# Patient Record
Sex: Female | Born: 1990 | Hispanic: No | Marital: Single | Smoking: Current every day smoker
Health system: Southern US, Community
[De-identification: ages and names within clinical notes are randomized; demographics above are authoritative.]

## PROBLEM LIST (undated history)

## (undated) DIAGNOSIS — J45909 Unspecified asthma, uncomplicated: Secondary | ICD-10-CM

## (undated) DIAGNOSIS — F319 Bipolar disorder, unspecified: Secondary | ICD-10-CM

## (undated) DIAGNOSIS — F259 Schizoaffective disorder, unspecified: Secondary | ICD-10-CM

## (undated) DIAGNOSIS — F250 Schizoaffective disorder, bipolar type: Secondary | ICD-10-CM

---
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
## 2010-05-21 ENCOUNTER — Emergency Department: Payer: Self-pay | Admitting: Emergency Medicine
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
## 2010-11-18 LAB — CBC
HCT: 40.7
MCV: 93.2
RBC: 4.37
WBC: 6.4

## 2010-11-18 LAB — BASIC METABOLIC PANEL
CO2: 26
Chloride: 105
Potassium: 3.9
Sodium: 138

## 2010-11-18 LAB — URINALYSIS, ROUTINE W REFLEX MICROSCOPIC
Bilirubin Urine: NEGATIVE
Ketones, ur: NEGATIVE
Nitrite: NEGATIVE
Protein, ur: NEGATIVE
Specific Gravity, Urine: 1.031 — ABNORMAL HIGH
Urobilinogen, UA: 1

## 2010-11-18 LAB — LIPID PANEL
Cholesterol: 130
Total CHOL/HDL Ratio: 4.1

## 2010-11-18 LAB — CORTISOL-AM, BLOOD: Cortisol - AM: 14.5

## 2010-11-18 LAB — T3, FREE: T3, Free: 3.9 (ref 2.3–4.2)

## 2010-11-18 LAB — GC/CHLAMYDIA PROBE AMP, URINE
Chlamydia, Swab/Urine, PCR: NEGATIVE
GC Probe Amp, Urine: NEGATIVE

## 2010-11-18 LAB — T4, FREE: Free T4: 1.14

## 2010-11-18 LAB — RPR: RPR Ser Ql: NONREACTIVE

## 2010-11-18 LAB — URINE MICROSCOPIC-ADD ON

## 2011-03-05 ENCOUNTER — Ambulatory Visit: Payer: Self-pay | Admitting: Internal Medicine

## 2011-10-05 ENCOUNTER — Emergency Department: Payer: Self-pay | Admitting: Internal Medicine

## 2011-12-30 ENCOUNTER — Emergency Department: Payer: Self-pay | Admitting: Emergency Medicine

## 2011-12-30 LAB — COMPREHENSIVE METABOLIC PANEL
Albumin: 3.9 g/dL (ref 3.4–5.0)
Alkaline Phosphatase: 85 U/L (ref 50–136)
BUN: 15 mg/dL (ref 7–18)
Calcium, Total: 8.8 mg/dL (ref 8.5–10.1)
Chloride: 110 mmol/L — ABNORMAL HIGH (ref 98–107)
EGFR (African American): >60
Glucose: 98 mg/dL (ref 65–99)
SGOT(AST): 22 U/L (ref 15–37)
Sodium: 139 mmol/L (ref 136–145)
Total Protein: 7.6 g/dL (ref 6.4–8.2)

## 2011-12-30 LAB — CBC WITH DIFFERENTIAL/PLATELET
Basophil #: 0.1 10*3/uL (ref 0.0–0.1)
Eosinophil #: 0.4 10*3/uL (ref 0.0–0.7)
Eosinophil %: 3.4 %
HCT: 44.4 % (ref 35.0–47.0)
HGB: 15.5 g/dL (ref 12.0–16.0)
Lymphocyte #: 2 10*3/uL (ref 1.0–3.6)
Lymphocyte %: 18.5 %
Neutrophil #: 7.7 10*3/uL — ABNORMAL HIGH (ref 1.4–6.5)
Neutrophil %: 71.6 %
RBC: 4.71 10*6/uL (ref 3.80–5.20)

## 2011-12-30 LAB — URINALYSIS, COMPLETE
Bilirubin,UR: NEGATIVE
Blood: NEGATIVE
Leukocyte Esterase: NEGATIVE
Ph: 6 (ref 4.5–8.0)
Protein: NEGATIVE
RBC,UR: 2 /HPF (ref 0–5)
Specific Gravity: 1.03 (ref 1.003–1.030)

## 2011-12-30 LAB — LIPASE, BLOOD: Lipase: 293 U/L (ref 73–393)

## 2011-12-30 LAB — PREGNANCY, URINE: Pregnancy Test, Urine: NEGATIVE m[IU]/mL

## 2012-05-14 ENCOUNTER — Emergency Department: Payer: Self-pay | Admitting: Emergency Medicine

## 2012-08-05 ENCOUNTER — Emergency Department: Payer: Self-pay | Admitting: Emergency Medicine

## 2012-08-05 LAB — URINALYSIS, COMPLETE
Bacteria: NONE SEEN
Bilirubin,UR: NEGATIVE
Glucose,UR: NEGATIVE mg/dL (ref 0–75)
Leukocyte Esterase: NEGATIVE
Nitrite: NEGATIVE
Ph: 5 (ref 4.5–8.0)
Protein: NEGATIVE
RBC,UR: <1 /HPF (ref 0–5)
Specific Gravity: 1.019 (ref 1.003–1.030)
WBC UR: 1 /HPF (ref 0–5)

## 2012-08-05 LAB — CBC
HGB: 14.7 g/dL (ref 12.0–16.0)
MCH: 32.3 pg (ref 26.0–34.0)
MCHC: 34.3 g/dL (ref 32.0–36.0)
MCV: 94 fL (ref 80–100)
Platelet: 199 10*3/uL (ref 150–440)
RDW: 13.4 % (ref 11.5–14.5)
WBC: 10.1 10*3/uL (ref 3.6–11.0)

## 2012-08-05 LAB — COMPREHENSIVE METABOLIC PANEL
Albumin: 3.7 g/dL (ref 3.4–5.0)
Anion Gap: 8 (ref 7–16)
Calcium, Total: 9.1 mg/dL (ref 8.5–10.1)
Chloride: 110 mmol/L — ABNORMAL HIGH (ref 98–107)
EGFR (African American): >60
EGFR (Non-African Amer.): >60
Osmolality: 280 (ref 275–301)
Potassium: 3.6 mmol/L (ref 3.5–5.1)
SGPT (ALT): 22 U/L (ref 12–78)
Sodium: 140 mmol/L (ref 136–145)
Total Protein: 7.6 g/dL (ref 6.4–8.2)

## 2012-08-06 ENCOUNTER — Emergency Department: Payer: Self-pay | Admitting: Emergency Medicine

## 2012-11-05 ENCOUNTER — Emergency Department: Payer: Self-pay | Admitting: Emergency Medicine

## 2012-11-05 LAB — CBC
HCT: 44.3 % (ref 35.0–47.0)
HGB: 15.6 g/dL (ref 12.0–16.0)
MCH: 32.9 pg (ref 26.0–34.0)
MCHC: 35.1 g/dL (ref 32.0–36.0)
MCV: 94 fL (ref 80–100)
Platelet: 220 10*3/uL (ref 150–440)
WBC: 10.1 10*3/uL (ref 3.6–11.0)

## 2012-11-05 LAB — DRUG SCREEN, URINE
Barbiturates, Ur Screen: NEGATIVE (ref ?–200)
Benzodiazepine, Ur Scrn: NEGATIVE (ref ?–200)
Cocaine Metabolite,Ur ~~LOC~~: NEGATIVE (ref ?–300)
MDMA (Ecstasy)Ur Screen: NEGATIVE (ref ?–500)
Methadone, Ur Screen: NEGATIVE (ref ?–300)
Opiate, Ur Screen: NEGATIVE (ref ?–300)
Tricyclic, Ur Screen: NEGATIVE (ref ?–1000)

## 2012-11-05 LAB — ETHANOL
Ethanol %: 0.224 % — ABNORMAL HIGH (ref 0.000–0.080)
Ethanol: 224 mg/dL

## 2012-11-05 LAB — COMPREHENSIVE METABOLIC PANEL
Albumin: 3.8 g/dL (ref 3.4–5.0)
Alkaline Phosphatase: 88 U/L (ref 50–136)
Chloride: 107 mmol/L (ref 98–107)
Creatinine: 0.97 mg/dL (ref 0.60–1.30)
EGFR (African American): >60
Osmolality: 279 (ref 275–301)
Potassium: 3.6 mmol/L (ref 3.5–5.1)
SGOT(AST): 33 U/L (ref 15–37)
Total Protein: 7.6 g/dL (ref 6.4–8.2)

## 2012-11-05 LAB — URINALYSIS, COMPLETE
Bacteria: NONE SEEN
Bilirubin,UR: NEGATIVE
Glucose,UR: NEGATIVE mg/dL (ref 0–75)
Ketone: NEGATIVE
Leukocyte Esterase: NEGATIVE
Nitrite: NEGATIVE
RBC,UR: <1 /HPF (ref 0–5)
Specific Gravity: 1.005 (ref 1.003–1.030)
Squamous Epithelial: 2
WBC UR: 1 /HPF (ref 0–5)

## 2013-01-10 ENCOUNTER — Emergency Department: Payer: Self-pay | Admitting: Emergency Medicine

## 2013-04-20 ENCOUNTER — Emergency Department: Payer: Self-pay | Admitting: Emergency Medicine

## 2013-04-30 ENCOUNTER — Emergency Department: Payer: Self-pay | Admitting: Emergency Medicine

## 2013-05-03 LAB — BETA STREP CULTURE(ARMC)

## 2013-06-20 ENCOUNTER — Emergency Department: Payer: Self-pay | Admitting: Emergency Medicine

## 2013-06-20 LAB — DRUG SCREEN, URINE
Amphetamines, Ur Screen: NEGATIVE (ref ?–1000)
BARBITURATES, UR SCREEN: NEGATIVE (ref ?–200)
Benzodiazepine, Ur Scrn: NEGATIVE (ref ?–200)
CANNABINOID 50 NG, UR ~~LOC~~: POSITIVE (ref ?–50)
Cocaine Metabolite,Ur ~~LOC~~: POSITIVE (ref ?–300)
MDMA (Ecstasy)Ur Screen: NEGATIVE (ref ?–500)
Methadone, Ur Screen: NEGATIVE (ref ?–300)
Opiate, Ur Screen: NEGATIVE (ref ?–300)
PHENCYCLIDINE (PCP) UR S: NEGATIVE (ref ?–25)
TRICYCLIC, UR SCREEN: NEGATIVE (ref ?–1000)

## 2013-06-20 LAB — COMPREHENSIVE METABOLIC PANEL
ALBUMIN: 4 g/dL (ref 3.4–5.0)
Alkaline Phosphatase: 81 U/L
Anion Gap: 14 (ref 7–16)
BILIRUBIN TOTAL: 0.2 mg/dL (ref 0.2–1.0)
BUN: 12 mg/dL (ref 7–18)
Calcium, Total: 8.5 mg/dL (ref 8.5–10.1)
Chloride: 105 mmol/L (ref 98–107)
Co2: 19 mmol/L — ABNORMAL LOW (ref 21–32)
Creatinine: 1.12 mg/dL (ref 0.60–1.30)
EGFR (African American): >60
EGFR (Non-African Amer.): >60
Glucose: 143 mg/dL — ABNORMAL HIGH (ref 65–99)
Osmolality: 278 (ref 275–301)
Potassium: 3.2 mmol/L — ABNORMAL LOW (ref 3.5–5.1)
SGOT(AST): 36 U/L (ref 15–37)
SGPT (ALT): 27 U/L (ref 12–78)
Sodium: 138 mmol/L (ref 136–145)
TOTAL PROTEIN: 8.2 g/dL (ref 6.4–8.2)

## 2013-06-20 LAB — CBC WITH DIFFERENTIAL/PLATELET
BASOS ABS: 0.1 10*3/uL (ref 0.0–0.1)
Basophil %: 0.9 %
EOS PCT: 0.5 %
Eosinophil #: 0.1 10*3/uL (ref 0.0–0.7)
HCT: 46.2 % (ref 35.0–47.0)
HGB: 15.1 g/dL (ref 12.0–16.0)
LYMPHS ABS: 3 10*3/uL (ref 1.0–3.6)
Lymphocyte %: 21.2 %
MCH: 31.8 pg (ref 26.0–34.0)
MCHC: 32.7 g/dL (ref 32.0–36.0)
MCV: 97 fL (ref 80–100)
MONO ABS: 0.9 x10 3/mm (ref 0.2–0.9)
Monocyte %: 6.5 %
NEUTROS PCT: 70.9 %
Neutrophil #: 10.1 10*3/uL — ABNORMAL HIGH (ref 1.4–6.5)
Platelet: 290 10*3/uL (ref 150–440)
RBC: 4.74 10*6/uL (ref 3.80–5.20)
RDW: 14.2 % (ref 11.5–14.5)
WBC: 14.2 10*3/uL — ABNORMAL HIGH (ref 3.6–11.0)

## 2013-06-20 LAB — ETHANOL
Ethanol %: 0.268 % — ABNORMAL HIGH (ref 0.000–0.080)
Ethanol: 268 mg/dL

## 2013-10-22 ENCOUNTER — Emergency Department: Payer: Self-pay | Admitting: Emergency Medicine

## 2014-04-20 ENCOUNTER — Emergency Department: Payer: Self-pay | Admitting: Emergency Medicine

## 2014-06-02 NOTE — Consult Note (Signed)
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   Patient was seen by consultant.   Consult note dictated.   Recommend further assessment or treatment.   Comments: [name] was brought to ER after episode of seizures. She was positive for alcohol and MJ. She is not interested in substance abuse treatment. She is not suicidal or homicidal.   PLAN: 1. The patient no longer meets criteria for IVC. I will terminate proceedings.   2. Substance abuse: The patient minimizes her problems and declines substance abuse treatment.   3. Anxiety: The patient was given information about local walk-in clinic.  4. No medications recommended.  Electronic Signatures: Kristine LineaPucilowska, Haruka Kowaleski (MD)  (Signed 26-Sep-14 11:29)  Authored: Brief Consult Note   Last Updated: 26-Sep-14 11:29 by Kristine LineaPucilowska, Jalisha Enneking (MD)

## 2014-06-02 NOTE — Consult Note (Signed)
PATIENT NAME:  Briana Bradley, Briana Bradley MR#:  [id number] DATE OF BIRTH:  Sep 16, 1990  DATE OF CONSULTATION:  11/05/2012  REFERRING PHYSICIAN:  Enedina Finnerandolph N. Manson PasseyBrown, MD CONSULTING PHYSICIAN:  Sury Wentworth Bradley. Jaslin Novitski, MD  REASON FOR CONSULTATION: To evaluate a suicidal patient.   IDENTIFYING DATA: [name] is a 24 year old female with history of mood instability, substance abuse and seizures.   CHIEF COMPLAINT: "I want to go home."   HISTORY OF PRESENT ILLNESS: [name] has not been seeing any mental health professionals lately. She reports that she has been doing very well and she felt stable. She was brought to the Emergency Room after an episode of seizures. She has not been taking antiseizure medication due to loss of insurance and poor financial standing.  She was admitted to [facility] in 2009 for status epilepticus but has been off medications since she lost her insurance. She denies frequent seizures and has no idea what precipitated the current episode. Reportedly, she had a 40-ounce beer and a shot of vodka when it happened. She was sitting comfortable in a recliner. She is negative for any other substances but it is not impossible that she has been using substances that we do not detect. She was positive for marijuana on urine tox screen but denies using synthetic marijuana or spice. The patient reportedly, while postictal, made some suicidal statements. When her mental status cleared, the patient adamantly denies wanting to hurt herself, any thoughts of suicide, intention or a plan. We spoke with her father who feels that the patient is well-balanced and safe to return to home. She denies symptoms of depression. She reports some anxiety, especially when people argue with her or around her, or if there is any violence she gets frightened and panicky. She will consider seeing a mental health professional to treat that. She denies excessive alcohol use or illicit substance use. There  is no history of prescription pill abuse.   PAST PSYCHIATRIC HISTORY: She [facility] at [facility] as a teenager after an episode of cutting. She denies any current cutting now. She does not consider cutting a suicide attempt and denies that she ever attempted suicide.   FAMILY PSYCHIATRIC HISTORY: The father is a recovering alcoholic, mother with history of cocaine, brother with alcohol problem and cutting.   PAST MEDICAL HISTORY: Seizures.   ALLERGIES: No known drug allergies.   MEDICATIONS ON ADMISSION: None.   SOCIAL HISTORY: She is from our area. She did not graduate from high school but has her GED. 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 She can be fully trusted with the baby. She is not in a relationship. She reports that she has been amenorrheic for the past 2 years. She has been checked, some tests have been done. There is nothing wrong with her but she does not believe that she could have children.   REVIEW OF SYSTEMS CONSTITUTIONAL: No fevers or chills. No weight changes.  EYES: No double or blurred vision.  ENT: No hearing loss.  RESPIRATORY: No shortness of breath or cough.  CARDIOVASCULAR: No chest pain or orthopnea.  GASTROINTESTINAL: No abdominal pain, nausea, vomiting or diarrhea.  GENITOURINARY: No incontinence or frequency.  Positive for amenorrhea.  ENDOCRINE: No heat or cold intolerance.  LYMPHATIC: No anemia or easy bruising.  INTEGUMENTARY: No acne or rash.  MUSCULOSKELETAL: No muscle or joint pain.  NEUROLOGIC: No tingling or weakness.  PSYCHIATRIC:  See history of present illness for details.   PHYSICAL EXAMINATION VITAL SIGNS: Blood pressure 137/77, pulse 104, respirations 22, temperature 98.  GENERAL: This is a slightly obese female in no acute distress. The rest of the physical examination is deferred to her primary attending.   LABORATORY DATA: Chemistries within normal limits. Blood  alcohol level is 0.224.  LFTs within normal limits. Urine tox screen positive for cannabinoids. CBC within normal limits. Urinalysis is not suggestive of urinary tract infection. Urine pregnancy test is negative.   MENTAL STATUS EXAMINATION: The patient is alert and oriented to person, place, time and situation. She is pleasant, polite and cooperative. She is well groomed, wearing hospital scrubs. She has multiple tattoos. She maintains good eye contact. Her speech is of normal rhythm, rate and volume. Mood is fine with full affect. Thought process is logical and goal oriented. Thought content: She denies suicidal or homicidal ideation. There are no delusions or paranoia. There are no auditory or visual hallucinations. Her cognition is grossly intact. Her insight and judgment are questionable.   DIAGNOSES AXIS I: Alcohol abuse, cannabis abuse, panic disorder without agoraphobia, history of diagnosis of bipolar disorder.  AXIS II: Deferred.  AXIS III: Seizures, amenorrhea. AXIS IV: Mental illness, substance abuse. AXIS V: GAF 50.   PLAN 1.  The patient no longer meets criteria for IVC. I will terminate proceedings. Please discharge as appropriate.  2.  Substance abuse: The patient minimizes her problems and declines substance abuse treatment.  3.  Anxiety: The patient was given information about local walk-in clinic if she wishes to treat anxiety.  4.  No medications recommended.   ____________________________ Ellin Goodie. Jennet Maduro, MD jbp:cs D: 11/05/2012 20:18:39 ET T: 11/05/2012 20:39:37 ET JOB#: [id number]  cc: Constance Hackenberg Bradley. Jennet Maduro, MD, <Dictator> Shari Prows MD ELECTRONICALLY SIGNED 11/18/2012 7:41

## 2014-06-10 ENCOUNTER — Emergency Department: Admit: 2014-06-10 | Disposition: A | Discharge status: Home / Self Care | Payer: Self-pay | Admitting: Internal Medicine

## 2014-06-10 LAB — COMPREHENSIVE METABOLIC PANEL
ALBUMIN: 4.2 g/dL
ALK PHOS: 66 U/L
Anion Gap: 4 — ABNORMAL LOW (ref 7–16)
BUN: 14 mg/dL
Bilirubin,Total: 0.5 mg/dL
Calcium, Total: 8.8 mg/dL — ABNORMAL LOW
Chloride: 108 mmol/L
Co2: 27 mmol/L
Creatinine: 0.81 mg/dL
EGFR (African American): >60
EGFR (Non-African Amer.): >60
GLUCOSE: 76 mg/dL
POTASSIUM: 3.7 mmol/L
SGOT(AST): 18 U/L
SGPT (ALT): 15 U/L
Sodium: 139 mmol/L
Total Protein: 7.6 g/dL

## 2014-06-10 LAB — URINALYSIS, COMPLETE
BILIRUBIN, UR: NEGATIVE
BLOOD: NEGATIVE
Bacteria: NONE SEEN
Glucose,UR: NEGATIVE mg/dL (ref 0–75)
KETONE: NEGATIVE
LEUKOCYTE ESTERASE: NEGATIVE
Nitrite: NEGATIVE
PH: 5 (ref 4.5–8.0)
Specific Gravity: 1.036 (ref 1.003–1.030)

## 2014-06-10 LAB — CBC
HCT: 42.9 % (ref 35.0–47.0)
HGB: 14.3 g/dL (ref 12.0–16.0)
MCH: 31 pg (ref 26.0–34.0)
MCHC: 33.3 g/dL (ref 32.0–36.0)
MCV: 93 fL (ref 80–100)
Platelet: 246 10*3/uL (ref 150–440)
RBC: 4.6 10*6/uL (ref 3.80–5.20)
RDW: 13 % (ref 11.5–14.5)
WBC: 11.2 10*3/uL — ABNORMAL HIGH (ref 3.6–11.0)

## 2014-06-10 LAB — GC/CHLAMYDIA PROBE AMP

## 2014-06-10 LAB — WET PREP, GENITAL

## 2014-12-04 ENCOUNTER — Emergency Department
Admission: EM | Admit: 2014-12-04 | Discharge: 2014-12-04 | Disposition: A | Discharge status: Home / Self Care | Payer: Self-pay | Attending: Emergency Medicine | Admitting: Emergency Medicine

## 2014-12-04 ENCOUNTER — Encounter: Payer: Self-pay | Admitting: Emergency Medicine

## 2014-12-04 DIAGNOSIS — G44019 Episodic cluster headache, not intractable: Secondary | ICD-10-CM | POA: Insufficient documentation

## 2014-12-04 DIAGNOSIS — Z3202 Encounter for pregnancy test, result negative: Secondary | ICD-10-CM | POA: Insufficient documentation

## 2014-12-04 DIAGNOSIS — Z720 Tobacco use: Secondary | ICD-10-CM | POA: Insufficient documentation

## 2014-12-04 DIAGNOSIS — R630 Anorexia: Secondary | ICD-10-CM | POA: Insufficient documentation

## 2014-12-04 HISTORY — DX: Unspecified asthma, uncomplicated: J45.909

## 2014-12-04 LAB — URINALYSIS COMPLETE WITH MICROSCOPIC (ARMC ONLY)
BACTERIA UA: NONE SEEN
Bilirubin Urine: NEGATIVE
Glucose, UA: NEGATIVE mg/dL
Ketones, ur: NEGATIVE mg/dL
LEUKOCYTES UA: NEGATIVE
NITRITE: NEGATIVE
PROTEIN: NEGATIVE mg/dL
SPECIFIC GRAVITY, URINE: 1.016 (ref 1.005–1.030)
pH: 6 (ref 5.0–8.0)

## 2014-12-04 LAB — PREGNANCY, URINE: PREG TEST UR: NEGATIVE

## 2014-12-04 MED ORDER — DIPHENHYDRAMINE HCL 25 MG PO CAPS
50.0000 mg | ORAL_CAPSULE | Freq: Once | ORAL | Status: AC
  Administered 2014-12-04: 50 mg via ORAL
  Filled 2014-12-04: qty 2

## 2014-12-04 MED ORDER — SODIUM CHLORIDE 0.9 % IV BOLUS (SEPSIS)
500.0000 mL | Freq: Once | INTRAVENOUS | Status: AC
  Administered 2014-12-04: 500 mL via INTRAVENOUS

## 2014-12-04 MED ORDER — KETOROLAC TROMETHAMINE 30 MG/ML IJ SOLN
30.0000 mg | Freq: Once | INTRAMUSCULAR | Status: AC
  Administered 2014-12-04: 30 mg via INTRAVENOUS
  Filled 2014-12-04: qty 1

## 2014-12-04 MED ORDER — METOCLOPRAMIDE HCL 5 MG/ML IJ SOLN
10.0000 mg | Freq: Once | INTRAMUSCULAR | Status: AC
  Administered 2014-12-04: 10 mg via INTRAVENOUS
  Filled 2014-12-04: qty 2

## 2014-12-04 MED ORDER — ORPHENADRINE CITRATE 30 MG/ML IJ SOLN
60.0000 mg | INTRAMUSCULAR | Status: AC
  Administered 2014-12-04: 60 mg via INTRAVENOUS
  Filled 2014-12-04: qty 2

## 2014-12-04 NOTE — ED Provider Notes (Signed)
[facility] Emergency Department Provider Note ____________________________________________  Time seen: 1513  I have reviewed the triage vital signs and the nursing notes.  HISTORY  Chief Complaint  Headache  HPI Briana Bradley is a 24 y.o. female the ED for evaluation of a headache over the last 4 days. She describes the headache as pressure, that she localizes from the bilateral temples down to the back of the neck. She also notes a decrease in appetite, light sensitivity, some intermittent dizziness and blurred vision. She denies any syncope, weakness, or memory loss. She has noted a decreased appetite over the last few days, and some intermittent nausea and vomiting over the last 3 days. She reports some easing off of the headache pain when she doses of ibuprofen and Tylenol. She notes the pain a 6/10 with the medications, and a 10/10 at worst. She denies any head injury, trauma, or recent illness. He does not have a history of workup for migraine.  Past Medical History  Diagnosis Date  . Asthma     There are no active problems to display for this patient.   History reviewed. No pertinent past surgical history.  Current Outpatient Rx  Name  Route  Sig  Dispense  Refill  . albuterol (PROVENTIL HFA;VENTOLIN HFA) 108 (90 BASE) MCG/ACT inhaler   Inhalation   Inhale 2 puffs into the lungs every 6 (six) hours as needed for wheezing or shortness of breath.           Allergies Review of patient's allergies indicates no known allergies.  No family history on file.  Social History Social History  Substance Use Topics  . Smoking status: Current Some Day Smoker  . Smokeless tobacco: None  . Alcohol Use: No   Review of Systems  Constitutional: Negative for fever. Eyes: Negative for visual changes. ENT: Negative for sore throat. Cardiovascular: Negative for chest pain. Respiratory: Negative for shortness of breath. Gastrointestinal: Negative for  abdominal pain, vomiting and diarrhea. Genitourinary: Negative for dysuria. Musculoskeletal: Negative for back pain. Skin: Negative for rash. Neurological: Reports headaches as above. Negative for focal weakness or numbness. ____________________________________________  PHYSICAL EXAM:  VITAL SIGNS: ED Triage Vitals  Enc Vitals Group     BP 12/04/14 1416 133/81 mmHg     Pulse Rate 12/04/14 1416 51     Resp 12/04/14 1416 18     Temp 12/04/14 1416 98.6 F (37 C)     Temp Source 12/04/14 1416 Oral     SpO2 12/04/14 1416 100 %     Weight 12/04/14 1416 200 lb (90.719 kg)     Height 12/04/14 1416 5\' 6"  (1.676 m)     Head Cir --      Peak Flow --      Pain Score 12/04/14 1417 10     Pain Loc --      Pain Edu? --      Excl. in GC? --    Constitutional: Alert and oriented. Well appearing and in no distress. Head: Normocephalic and atraumatic.      Eyes: Conjunctivae are normal. PERRL. Normal extraocular movements      Ears: Canals clear. TMs intact bilaterally.   Nose: No congestion/rhinorrhea.   Mouth/Throat: Mucous membranes are moist.   Neck: Supple. No thyromegaly. Hematological/Lymphatic/Immunological: No cervical lymphadenopathy. Cardiovascular: Normal rate, regular rhythm.  Respiratory: Normal respiratory effort. No wheezes/rales/rhonchi. Gastrointestinal: Soft and nontender. No distention. Musculoskeletal: Nontender with normal range of motion in all extremities.  Neurologic: Cranial nerves  II through XII grossly intact. Normal UE and LE DTRs bilaterally. Normal gait without ataxia. Normal speech and language. No gross focal neurologic deficits are appreciated. Skin:  Skin is warm, dry and intact. No rash noted. Psychiatric: Mood and affect are normal. Patient exhibits appropriate insight and judgment. ____________________________________________   LABS (pertinent positives/negatives) Labs Reviewed  URINALYSIS COMPLETEWITH MICROSCOPIC ([facility] ONLY) - Abnormal;  Notable for the following:    Color, Urine YELLOW (*)    APPearance HAZY (*)    Hgb urine dipstick 2+ (*)    Squamous Epithelial / LPF 6-30 (*)    All other components within normal limits  PREGNANCY, URINE  ____________________________________________  PROCEDURES  NS 500 ml bolus Toradol 30 mg IVP Reglan 10 mg IVP Norflex 60 mg IVP Benadryl 50 mg PO ____________________________________________  INITIAL IMPRESSION / ASSESSMENT AND PLAN / ED COURSE  Patient with an acute migraine presentation without neuromuscular deficit on exam. Patient with headache pattern consistent with cluster-type headache. Pain decreased following treatment. Follow-up with primary care provider as needed.  ____________________________________________  FINAL CLINICAL IMPRESSION(S) / ED DIAGNOSES  Final diagnoses:  Episodic cluster headache, not intractable      Lissa Hoard, PA-C 12/04/14 1703  Jene Every, MD 12/04/14 2241

## 2014-12-04 NOTE — ED Notes (Signed)
Pt with headache for four days. Unable to get relief with tylenol or ibu at home.

## 2014-12-04 NOTE — Discharge Instructions (Signed)
Cluster Headache  Cluster headaches are recognized by their pattern of deep, intense head pain. They normally occur on one side of your head, but they may "switch sides" in subsequent episodes. Typically, cluster headaches:   · Are severe in nature.    · Occur repeatedly over weeks to months and are followed by periods of no headaches.    · Can last from 15 minutes to 3 hours.    · Occur at the same time each day, often at night.    · Occur several times a day.  CAUSES  The exact cause of cluster headaches is not known. Alcohol use may be associated with cluster headaches.  SIGNS AND SYMPTOMS   · Severe pain that begins in or around your eye or temple.    · One-sided head pain.    · Feeling sick to your stomach (nauseous).    · Sensitivity to light.    · Runny nose.    · Eye redness, tearing, and nasal stuffiness on the side of your head where you are experiencing pain.    · Sweaty, pale skin of the face.    · Droopy or swollen eyelid.    · Restlessness.  DIAGNOSIS   Cluster headaches are diagnosed based on symptoms and a physical exam. Your health care provider may order a CT scan or an MRI of your head or lab tests to see if your headaches are caused by other medical conditions.   TREATMENT   · Medicines for pain relief and to prevent recurrent attacks. Some people may need a combination of medicines.  · Oxygen for pain relief.    · Biofeedback programs to help reduce headache pain.    It may be helpful to keep a headache diary. This may help you find a trend for what is triggering your headaches. Your health care provider can develop a treatment plan.   HOME CARE INSTRUCTIONS   During cluster periods:   · Follow a regular sleep schedule. Do not vary the amount and time that you sleep from day to day. It is important to stay on the same schedule during a cluster period to help prevent headaches.    · Avoid alcohol.    · Stop smoking if you smoke.    SEEK MEDICAL CARE IF:  · You have any changes from your previous  cluster headaches either in intensity or frequency.    · You are not getting relief from medicines you are taking.    SEEK IMMEDIATE MEDICAL CARE IF:   · You faint.    · You have weakness or numbness, especially on one side of your body or face.    · You have double vision.    · You have nausea or vomiting that is not relieved within several hours.    · You cannot keep your balance or have difficulty talking or walking.    · You have neck pain or stiffness.    · You have a fever.  MAKE SURE YOU:  · Understand these instructions.    · Will watch your condition.    · Will get help right away if you are not doing well or get worse.     This information is not intended to replace advice given to you by your health care provider. Make sure you discuss any questions you have with your health care provider.     Document Released: 01/27/2005 Document Revised: 11/17/2012 Document Reviewed: 08/19/2012  Elsevier Interactive Patient Education ©2016 [employer]

## 2015-06-02 ENCOUNTER — Emergency Department
Admission: EM | Admit: 2015-06-02 | Discharge: 2015-06-02 | Disposition: A | Discharge status: Home / Self Care | Payer: Self-pay | Attending: Emergency Medicine | Admitting: Emergency Medicine

## 2015-06-02 ENCOUNTER — Encounter: Payer: Self-pay | Admitting: Emergency Medicine

## 2015-06-02 DIAGNOSIS — H81399 Other peripheral vertigo, unspecified ear: Secondary | ICD-10-CM | POA: Insufficient documentation

## 2015-06-02 DIAGNOSIS — F319 Bipolar disorder, unspecified: Secondary | ICD-10-CM | POA: Insufficient documentation

## 2015-06-02 DIAGNOSIS — Z79899 Other long term (current) drug therapy: Secondary | ICD-10-CM | POA: Insufficient documentation

## 2015-06-02 DIAGNOSIS — J45909 Unspecified asthma, uncomplicated: Secondary | ICD-10-CM | POA: Insufficient documentation

## 2015-06-02 DIAGNOSIS — F17210 Nicotine dependence, cigarettes, uncomplicated: Secondary | ICD-10-CM | POA: Insufficient documentation

## 2015-06-02 HISTORY — DX: Bipolar disorder, unspecified: F31.9

## 2015-06-02 LAB — BASIC METABOLIC PANEL
ANION GAP: 6 (ref 5–15)
BUN: 12 mg/dL (ref 6–20)
CHLORIDE: 109 mmol/L (ref 101–111)
CO2: 26 mmol/L (ref 22–32)
Calcium: 9 mg/dL (ref 8.9–10.3)
Creatinine, Ser: 0.8 mg/dL (ref 0.44–1.00)
GFR calc Af Amer: >60 mL/min (ref >60)
GFR calc non Af Amer: >60 mL/min (ref >60)
GLUCOSE: 93 mg/dL (ref 65–99)
POTASSIUM: 3.6 mmol/L (ref 3.5–5.1)
Sodium: 141 mmol/L (ref 135–145)

## 2015-06-02 LAB — URINALYSIS COMPLETE WITH MICROSCOPIC (ARMC ONLY)
BACTERIA UA: NONE SEEN
Bilirubin Urine: NEGATIVE
Glucose, UA: NEGATIVE mg/dL
Hgb urine dipstick: NEGATIVE
Leukocytes, UA: NEGATIVE
NITRITE: NEGATIVE
PH: 5 (ref 5.0–8.0)
PROTEIN: 30 mg/dL — AB
SPECIFIC GRAVITY, URINE: 1.031 — AB (ref 1.005–1.030)

## 2015-06-02 LAB — CBC
HEMATOCRIT: 42.6 % (ref 35.0–47.0)
HEMOGLOBIN: 14.5 g/dL (ref 12.0–16.0)
MCH: 32.2 pg (ref 26.0–34.0)
MCHC: 34.1 g/dL (ref 32.0–36.0)
MCV: 94.3 fL (ref 80.0–100.0)
Platelets: 199 10*3/uL (ref 150–440)
RBC: 4.51 MIL/uL (ref 3.80–5.20)
RDW: 12.7 % (ref 11.5–14.5)
WBC: 6.1 10*3/uL (ref 3.6–11.0)

## 2015-06-02 MED ORDER — MECLIZINE HCL 25 MG PO TABS: 25.0000 mg | ORAL_TABLET | Freq: Three times a day (TID) | ORAL | Status: DC | PRN

## 2015-06-02 MED ORDER — ONDANSETRON 4 MG PO TBDP: 4.0000 mg | ORAL_TABLET | Freq: Four times a day (QID) | ORAL | Status: DC | PRN

## 2015-06-02 MED ORDER — ONDANSETRON HCL 4 MG/2ML IJ SOLN
4.0000 mg | Freq: Once | INTRAMUSCULAR | Status: AC
  Administered 2015-06-02: 4 mg via INTRAVENOUS
  Filled 2015-06-02: qty 2

## 2015-06-02 MED ORDER — SODIUM CHLORIDE 0.9 % IV BOLUS (SEPSIS)
1000.0000 mL | Freq: Once | INTRAVENOUS | Status: AC
  Administered 2015-06-02: 1000 mL via INTRAVENOUS

## 2015-06-02 MED ORDER — MECLIZINE HCL 25 MG PO TABS
25.0000 mg | ORAL_TABLET | Freq: Once | ORAL | Status: AC
  Administered 2015-06-02: 25 mg via ORAL
  Filled 2015-06-02: qty 1

## 2015-06-02 NOTE — Discharge Instructions (Signed)
We believe your symptoms were caused by benign vertigo.  Please read through the included information and take any prescribed medication(s).  Follow up with your doctor as listed above. ° °If you develop any new or worsening symptoms that concern you, including but not limited to persistent dizziness/vertigo, numbness or weakness in your arms or legs, altered mental status, persistent vomiting, or fever greater than 101, please return immediately to the Emergency Department. ° ° °Vertigo °Vertigo means you feel like you or your surroundings are moving when they are not. Vertigo can be dangerous if it occurs when you are at work, driving, or performing difficult activities.  °CAUSES  °Vertigo occurs when there is a conflict of signals sent to your brain from the visual and sensory systems in your body. There are many different causes of vertigo, including: °· Infections, especially in the inner ear. °· A bad reaction to a drug or misuse of alcohol and medicines. °· Withdrawal from drugs or alcohol. °· Rapidly changing positions, such as lying down or rolling over in bed. °· A migraine headache. °· Decreased blood flow to the brain. °· Increased pressure in the brain from a head injury, infection, tumor, or bleeding. °SYMPTOMS  °You may feel as though the world is spinning around or you are falling to the ground. Because your balance is upset, vertigo can cause nausea and vomiting. You may have involuntary eye movements (nystagmus). °DIAGNOSIS  °Vertigo is usually diagnosed by physical exam. If the cause of your vertigo is unknown, your caregiver may perform imaging tests, such as an MRI scan (magnetic resonance imaging). °TREATMENT  °Most cases of vertigo resolve on their own, without treatment. Depending on the cause, your caregiver may prescribe certain medicines. If your vertigo is related to body position issues, your caregiver may recommend movements or procedures to correct the problem. In rare cases, if your  vertigo is caused by certain inner ear problems, you may need surgery. °HOME CARE INSTRUCTIONS  °· Follow your caregiver's instructions. °· Avoid driving. °· Avoid operating heavy machinery. °· Avoid performing any tasks that would be dangerous to you or others during a vertigo episode. °· Tell your caregiver if you notice that certain medicines seem to be causing your vertigo. Some of the medicines used to treat vertigo episodes can actually make them worse in some people. °SEEK IMMEDIATE MEDICAL CARE IF:  °· Your medicines do not relieve your vertigo or are making it worse. °· You develop problems with talking, walking, weakness, or using your arms, hands, or legs. °· You develop severe headaches. °· Your nausea or vomiting continues or gets worse. °· You develop visual changes. °· A family member notices behavioral changes. °· Your condition gets worse. °MAKE SURE YOU: °· Understand these instructions. °· Will watch your condition. °· Will get help right away if you are not doing well or get worse. °  °This information is not intended to replace advice given to you by your health care provider. Make sure you discuss any questions you have with your health care provider. °  °Document Released: 11/06/2004 Document Revised: 04/21/2011 Document Reviewed: 05/22/2014 °Elsevier Interactive Patient Education ©2016 [employer] ° °

## 2015-06-02 NOTE — ED Provider Notes (Signed)
[facility] Emergency Department Provider Note  ____________________________________________  Time seen: Approximately 10:54 AM  I have reviewed the triage vital signs and the nursing notes.   HISTORY  Chief Complaint Loss of Consciousness and Vomiting    HPI Briana Bradley is a 25 y.o. female she is fairly healthy except for some anxiety issues.  The patient reports that [place], she was on a ride that's on her quickly and in the air. During the ride she suddenly developed dizziness, nausea and felt like the room was spinning. She is not sure, she said she got sweaty and not sure if she could've passed out for a second but her friend reports that are not really sure. She is able to get up off the ride but has felt persistently dizzy whenever she moves her head. She reports that the world's feels like it spinning when she gets up quickly or turns her head rapidly side to side.  No numbness, tingling, headache, weakness. She does report that she was vomiting due to dizziness. This is improved after receiving medicine in the ER. She's not had much to eat today due to the nausea.  There is no previous history of stroke. She denies any numbness tingling or weakness. No trouble speaking. No facial droop.   Past Medical History  Diagnosis Date  . Asthma   . Bipolar 1 disorder (HCC)     There are no active problems to display for this patient.   History reviewed. No pertinent past surgical history.  Current Outpatient Rx  Name  Route  Sig  Dispense  Refill  . albuterol (PROVENTIL HFA;VENTOLIN HFA) 108 (90 BASE) MCG/ACT inhaler   Inhalation   Inhale 2 puffs into the lungs every 6 (six) hours as needed for wheezing or shortness of breath.         . meclizine (ANTIVERT) 25 MG tablet   Oral   Take 1 tablet (25 mg total) by mouth 3 (three) times daily as needed for dizziness.   30 tablet   0   . ondansetron (ZOFRAN ODT) 4 MG  disintegrating tablet   Oral   Take 1 tablet (4 mg total) by mouth every 6 (six) hours as needed for nausea or vomiting.   20 tablet   0     Allergies Review of patient's allergies indicates no known allergies.  No family history on file.  Social History Social History  Substance Use Topics  . Smoking status: Current Every Day Smoker -- 1.00 packs/day    Types: Cigarettes  . Smokeless tobacco: None  . Alcohol Use: No    Review of Systems Constitutional: No fever/chills Eyes: No visual changes. ENT: No sore throat. Cardiovascular: Denies chest pain. Respiratory: Denies shortness of breath. Gastrointestinal: No abdominal pain.  No nausea, no vomiting.  No diarrhea.  No constipation. Genitourinary: Negative for dysuria. Musculoskeletal: Negative for back pain. Skin: Negative for rash. Neurological: Negative for headaches, focal weakness or numbness.  10-point ROS otherwise negative.  Denies pregnancy, reports she is sexually active only with a female partner. ____________________________________________   PHYSICAL EXAM:  VITAL SIGNS: ED Triage Vitals  Enc Vitals Group     BP 06/02/15 0916 123/78 mmHg     Pulse Rate 06/02/15 0916 65     Resp 06/02/15 0916 18     Temp 06/02/15 0916 97.8 F (36.6 C)     Temp Source 06/02/15 0916 Oral     SpO2 06/02/15 0916  97 %     Weight 06/02/15 0916 150 lb (68.04 kg)     Height 06/02/15 0916  (1.676 m)     Head Cir --      Peak Flow --      Pain Score 06/02/15 0921 0     Pain Loc --      Pain Edu? --      Excl. in GC? --    Constitutional: Alert and oriented. Well appearing and in no acute distress. Eyes: Conjunctivae are normal. PERRL. EOMI. Head: Atraumatic. Nose: No congestion/rhinnorhea. Normal tympanic membranes bilateral. Mouth/Throat: Mucous membranes are moist.  Oropharynx non-erythematous. Neck: No stridor.  No carotid bruit. Cardiovascular: Normal rate, regular rhythm. Grossly normal heart sounds.  Good  peripheral circulation. Respiratory: Normal respiratory effort.  No retractions. Lungs CTAB. Gastrointestinal: Soft and nontender.  Musculoskeletal: No lower extremity tenderness nor edema.  No joint effusions. Neurologic:  Normal speech and language. No gross focal neurologic deficits are appreciated. No gait instability.  Based on the patient's chief complaint of performed very detailed examination neurologically. NIH score equals 0, performed by me at bedside. The patient has no pronator drift. The patient has normal cranial nerve exam. Extraocular movements are normal. Visual fields are normal. Patient has 5 out of 5 strength in all extremities. There is no numbness or gross, acute sensory abnormality in the extremities bilaterally. No speech disturbance. No dysarthria. No aphasia. No ataxia. Normal finger nose finger bilat. Patient speaking in full and clear sentences.  Of note, when the patient is asked her head rapidly to left and right she does become nauseated and feels quite dizzy. She has some mild nystagmus that abates after about 10 seconds associated with head movement.   Skin:  Skin is warm, dry and intact. No rash noted. Psychiatric: Mood and affect are normal. Speech and behavior are normal.  ____________________________________________   LABS (all labs ordered are listed, but only abnormal results are displayed)  Labs Reviewed  URINALYSIS COMPLETEWITH MICROSCOPIC ([facility] ONLY) - Abnormal; Notable for the following:    Color, Urine YELLOW (*)    APPearance CLEAR (*)    Ketones, ur TRACE (*)    Specific Gravity, Urine 1.031 (*)    Protein, ur 30 (*)    Squamous Epithelial / LPF 0-5 (*)    All other components within normal limits  BASIC METABOLIC PANEL  CBC   ____________________________________________  EKG  ED ECG REPORT I, Trevion Hoben, the attending physician, personally viewed and interpreted this ECG.  Date: 06/02/2015 EKG Time: 9:30 Rate:  60 Rhythm: normal sinus rhythm QRS Axis: normal Intervals: normal ST/T Wave abnormalities: normal Conduction Disturbances: none Narrative Interpretation: unremarkable  ____________________________________________  RADIOLOGY  Based on the patient's chief complaint, reassuring neurologic exam and age and do not feel the patient is at high risk for acute ischemic stroke, hemorrhage, or or mass/tumor. Based on her presentation, clinical history of this starting during a fair ride and persisting with vertiginous symptoms associated mostly with head movements and feels she likely is suffering from vertigo, unlikely to be of a central source. At the present time based on her age, I do not feel the need for CT imaging of the head is needed. She denies any fevers, headaches, numbness tingling weakness or acute neurologic symptoms. ____________________________________________   PROCEDURES  Procedure(s) performed: None  Critical Care performed: No  ____________________________________________   INITIAL IMPRESSION / ASSESSMENT AND PLAN / ED COURSE  Pertinent labs & imaging results that were  available during my care of the patient were reviewed by me and considered in my medical decision making (see chart for details).  Patient presents with vertiginous symptoms. Does not sound as though she actually had a syncopal episode, but rather she got very nauseated, began experiencing what sounds like severe vertigo during this ride got very nauseated and has been persistently feeling dizzy and vertiginous symptoms since this is a primarily with head movement.  She was Zofran and meclizine and reassess. No evidence of acute neurologic symptoms. No cardiac or pulmonary complaint. EKG normal and reassuring. Lab work reassuring.  ----------------------------------------- 3:30 PM on 06/02/2015 -----------------------------------------  Patient reports feeling much better. Currently resting comfortably,  able to ambulate with no distress. ____________________________________________   FINAL CLINICAL IMPRESSION(S) / ED DIAGNOSES  Final diagnoses:  Peripheral vertigo, unspecified laterality      Sharyn Creamer, MD 06/02/15 1531

## 2015-06-02 NOTE — ED Notes (Signed)
Patient presents to the ED with complaint of nausea, vomiting, dizziness, and feeling light headed.  Patient states she began feeling badly while on a ride at the fair last [place].  Patient reports passing out during the ride and becoming very diaphoretic.  Patient states since then she has felt nauseous and dizzy with occasional vomiting.  Patient reports vomiting x 6 in the past 24 hours.

## 2015-06-08 ENCOUNTER — Emergency Department
Admission: EM | Admit: 2015-06-08 | Discharge: 2015-06-08 | Disposition: A | Discharge status: Home / Self Care | Payer: Self-pay | Attending: Emergency Medicine | Admitting: Emergency Medicine

## 2015-06-08 ENCOUNTER — Encounter: Payer: Self-pay | Admitting: *Deleted

## 2015-06-08 ENCOUNTER — Emergency Department: Payer: Self-pay

## 2015-06-08 DIAGNOSIS — F17210 Nicotine dependence, cigarettes, uncomplicated: Secondary | ICD-10-CM | POA: Insufficient documentation

## 2015-06-08 DIAGNOSIS — J45909 Unspecified asthma, uncomplicated: Secondary | ICD-10-CM | POA: Insufficient documentation

## 2015-06-08 DIAGNOSIS — Y999 Unspecified external cause status: Secondary | ICD-10-CM | POA: Insufficient documentation

## 2015-06-08 DIAGNOSIS — F1290 Cannabis use, unspecified, uncomplicated: Secondary | ICD-10-CM | POA: Insufficient documentation

## 2015-06-08 DIAGNOSIS — Y929 Unspecified place or not applicable: Secondary | ICD-10-CM | POA: Insufficient documentation

## 2015-06-08 DIAGNOSIS — Z79899 Other long term (current) drug therapy: Secondary | ICD-10-CM | POA: Insufficient documentation

## 2015-06-08 DIAGNOSIS — Y9364 Activity, baseball: Secondary | ICD-10-CM | POA: Insufficient documentation

## 2015-06-08 DIAGNOSIS — W2107XA Struck by softball, initial encounter: Secondary | ICD-10-CM | POA: Insufficient documentation

## 2015-06-08 DIAGNOSIS — F315 Bipolar disorder, current episode depressed, severe, with psychotic features: Secondary | ICD-10-CM | POA: Insufficient documentation

## 2015-06-08 DIAGNOSIS — S8012XA Contusion of left lower leg, initial encounter: Secondary | ICD-10-CM | POA: Insufficient documentation

## 2015-06-08 MED ORDER — OXYCODONE-ACETAMINOPHEN 5-325 MG PO TABS
ORAL_TABLET | ORAL | Status: AC
  Administered 2015-06-08: 1 via ORAL
  Filled 2015-06-08: qty 1

## 2015-06-08 MED ORDER — OXYCODONE-ACETAMINOPHEN 5-325 MG PO TABS
1.0000 | ORAL_TABLET | ORAL | Status: DC | PRN
  Administered 2015-06-08: 1 via ORAL

## 2015-06-08 MED ORDER — IBUPROFEN 600 MG PO TABS: 600.0000 mg | ORAL_TABLET | Freq: Four times a day (QID) | ORAL | Status: DC | PRN

## 2015-06-08 NOTE — Discharge Instructions (Signed)
Contusion °A contusion is a deep bruise. Contusions are the result of a blunt injury to tissues and muscle fibers under the skin. The injury causes bleeding under the skin. The skin overlying the contusion may turn blue, purple, or yellow. Minor injuries will give you a painless contusion, but more severe contusions may stay painful and swollen for a few weeks.  °CAUSES  °This condition is usually caused by a blow, trauma, or direct force to an area of the body. °SYMPTOMS  °Symptoms of this condition include: °· Swelling of the injured area. °· Pain and tenderness in the injured area. °· Discoloration. The area may have redness and then turn blue, purple, or yellow. °DIAGNOSIS  °This condition is diagnosed based on a physical exam and medical history. An X-ray, CT scan, or MRI may be needed to determine if there are any associated injuries, such as broken bones (fractures). °TREATMENT  °Specific treatment for this condition depends on what area of the body was injured. In general, the best treatment for a contusion is resting, icing, applying pressure to (compression), and elevating the injured area. This is often called the RICE strategy. Over-the-counter anti-inflammatory medicines may also be recommended for pain control.  °HOME CARE INSTRUCTIONS  °· Rest the injured area. °· If directed, apply ice to the injured area: °· Put ice in a plastic bag. °· Place a towel between your skin and the bag. °· Leave the ice on for 20 minutes, 2-3 times per day. °· If directed, apply light compression to the injured area using an elastic bandage. Make sure the bandage is not wrapped too tightly. Remove and reapply the bandage as directed by your health care provider. °· If possible, raise (elevate) the injured area above the level of your heart while you are sitting or lying down. °· Take over-the-counter and prescription medicines only as told by your health care provider. °SEEK MEDICAL CARE IF: °· Your symptoms do not  improve after several days of treatment. °· Your symptoms get worse. °· You have difficulty moving the injured area. °SEEK IMMEDIATE MEDICAL CARE IF:  °· You have severe pain. °· You have numbness in a hand or foot. °· Your hand or foot turns pale or cold. °  °This information is not intended to replace advice given to you by your health care provider. Make sure you discuss any questions you have with your health care provider. °  °Document Released: 11/06/2004 Document Revised: 10/18/2014 Document Reviewed: 06/14/2014 °Elsevier Interactive Patient Education ©2016 [employer] ° °Cryotherapy °Cryotherapy means treatment with cold. Ice or gel packs can be used to reduce both pain and swelling. Ice is the most helpful within the first 24 to 48 hours after an injury or flare-up from overusing a muscle or joint. Sprains, strains, spasms, burning pain, shooting pain, and aches can all be eased with ice. Ice can also be used when recovering from surgery. Ice is effective, has very few side effects, and is safe for most people to use. °PRECAUTIONS  °Ice is not a safe treatment option for people with: °· Raynaud phenomenon. This is a condition affecting small blood vessels in the extremities. Exposure to cold may cause your problems to return. °· Cold hypersensitivity. There are many forms of cold hypersensitivity, including: °¨ Cold urticaria. Red, itchy hives appear on the skin when the tissues begin to warm after being iced. °¨ Cold erythema. This is a red, itchy rash caused by exposure to cold. °¨ Cold hemoglobinuria. Red blood cells   break down when the tissues begin to warm after being iced. The hemoglobin that carry oxygen are passed into the urine because they cannot combine with blood proteins fast enough. °· Numbness or altered sensitivity in the area being iced. °If you have any of the following conditions, do not use ice until you have discussed cryotherapy with your caregiver: °· Heart conditions, such as  arrhythmia, angina, or chronic heart disease. °· High blood pressure. °· Healing wounds or open skin in the area being iced. °· Current infections. °· Rheumatoid arthritis. °· Poor circulation. °· Diabetes. °Ice slows the blood flow in the region it is applied. This is beneficial when trying to stop inflamed tissues from spreading irritating chemicals to surrounding tissues. However, if you expose your skin to cold temperatures for too long or without the proper protection, you can damage your skin or nerves. Watch for signs of skin damage due to cold. °HOME CARE INSTRUCTIONS °Follow these tips to use ice and cold packs safely. °· Place a dry or damp towel between the ice and skin. A damp towel will cool the skin more quickly, so you may need to shorten the time that the ice is used. °· For a more rapid response, add gentle compression to the ice. °· Ice for no more than 10 to 20 minutes at a time. The bonier the area you are icing, the less time it will take to get the benefits of ice. °· Check your skin after 5 minutes to make sure there are no signs of a poor response to cold or skin damage. °· Rest 20 minutes or more between uses. °· Once your skin is numb, you can end your treatment. You can test numbness by very lightly touching your skin. The touch should be so light that you do not see the skin dimple from the pressure of your fingertip. When using ice, most people will feel these normal sensations in this order: cold, burning, aching, and numbness. °· Do not use ice on someone who cannot communicate their responses to pain, such as small children or people with dementia. °HOW TO MAKE AN ICE PACK °Ice packs are the most common way to use ice therapy. Other methods include ice massage, ice baths, and cryosprays. Muscle creams that cause a cold, tingly feeling do not offer the same benefits that ice offers and should not be used as a substitute unless recommended by your caregiver. °To make an ice pack, do one  of the following: °· Place crushed ice or a bag of frozen vegetables in a sealable plastic bag. Squeeze out the excess air. Place this bag inside another plastic bag. Slide the bag into a pillowcase or place a damp towel between your skin and the bag. °· Mix 3 parts water with 1 part rubbing alcohol. Freeze the mixture in a sealable plastic bag. When you remove the mixture from the freezer, it will be slushy. Squeeze out the excess air. Place this bag inside another plastic bag. Slide the bag into a pillowcase or place a damp towel between your skin and the bag. °SEEK MEDICAL CARE IF: °· You develop white spots on your skin. This may give the skin a blotchy (mottled) appearance. °· Your skin turns blue or pale. °· Your skin becomes waxy or hard. °· Your swelling gets worse. °MAKE SURE YOU:  °· Understand these instructions. °· Will watch your condition. °· Will get help right away if you are not doing well or get worse. °  °  This information is not intended to replace advice given to you by your health care provider. Make sure you discuss any questions you have with your health care provider. °  °Document Released: 09/23/2010 Document Revised: 02/17/2014 Document Reviewed: 09/23/2010 °Elsevier Interactive Patient Education ©2016 [employer] ° °

## 2015-06-08 NOTE — ED Notes (Signed)
Patient stable and ambulatory. Patient verbalized understanding of the discharge instructions.   

## 2015-06-08 NOTE — ED Notes (Signed)
Pt. States she was playing softball catcher when ball hit pt. In lt. Lower leg.  Slight bruising to area, no deformity noted.

## 2015-06-08 NOTE — ED Notes (Signed)
Pt injured L leg during ball game. Ball struck L shin. Pt limping, presents w/ small amount of bruising to anterior shin.

## 2015-06-08 NOTE — ED Provider Notes (Signed)
[facility] Emergency Department Provider Note  ____________________________________________  Time seen: Approximately 10:19 PM  I have reviewed the triage vital signs and the nursing notes.   HISTORY  Chief Complaint Leg Pain    HPI Briana Bradley is a 25 y.o. female , NAD, presents to the emergency department with complaint of left lower leg pain. States she was playing softball as a [employer] this evening when a ball hit her in the anterior left shin. Has had swelling and bruising about the area since the injury. Denies any numbness, weakness, tingling. Has been able to bear weight but with pain and causing a lip. No known previous injuries to this leg.   Past Medical History  Diagnosis Date  . Asthma   . Bipolar 1 disorder (HCC)     There are no active problems to display for this patient.   History reviewed. No pertinent past surgical history.  Current Outpatient Rx  Name  Route  Sig  Dispense  Refill  . albuterol (PROVENTIL HFA;VENTOLIN HFA) 108 (90 BASE) MCG/ACT inhaler   Inhalation   Inhale 2 puffs into the lungs every 6 (six) hours as needed for wheezing or shortness of breath.         Marland Kitchen. ibuprofen (ADVIL,MOTRIN) 600 MG tablet   Oral   Take 1 tablet (600 mg total) by mouth every 6 (six) hours as needed.   30 tablet   0   . meclizine (ANTIVERT) 25 MG tablet   Oral   Take 1 tablet (25 mg total) by mouth 3 (three) times daily as needed for dizziness.   30 tablet   0   . ondansetron (ZOFRAN ODT) 4 MG disintegrating tablet   Oral   Take 1 tablet (4 mg total) by mouth every 6 (six) hours as needed for nausea or vomiting.   20 tablet   0     Allergies Review of patient's allergies indicates no known allergies.  History reviewed. No pertinent family history.  Social History Social History  Substance Use Topics  . Smoking status: Current Every Day Smoker -- 1.00 packs/day    Types: Cigarettes  . Smokeless tobacco: None  .  Alcohol Use: No     Review of Systems  Constitutional: No fatigue Musculoskeletal: Positive for leg pain. Negative for left ankle, knee pain.  Skin: Positive bruising and swelling about medial anterior shin. Negative for rash. Neurological: Negative for headaches, focal weakness or numbness. No tingling   ____________________________________________   PHYSICAL EXAM:  VITAL SIGNS: ED Triage Vitals  Enc Vitals Group     BP 06/08/15 2209 124/72 mmHg     Pulse Rate 06/08/15 2209 83     Resp 06/08/15 2209 20     Temp 06/08/15 2209 98.4 F (36.9 C)     Temp Source 06/08/15 2209 Oral     SpO2 06/08/15 2209 98 %     Weight 06/08/15 2209 165 lb (74.844 kg)     Height 06/08/15 2209 5\' 7"  (1.702 m)     Head Cir --      Peak Flow --      Pain Score 06/08/15 2210 7     Pain Loc --      Pain Edu? --      Excl. in GC? --      Constitutional: Alert and oriented. Well appearing and in no acute distress. Eyes: Conjunctivae are normal.  Head: Atraumatic. Cardiovascular: Good peripheral circulation with 2+ pulses noted in the  left lower extremity. Respiratory: Normal respiratory effort without tachypnea or retractions.  Musculoskeletal: Tender to palpation about the middle of the anterior left lower leg. Full range of motion of the left ankle and foot. Full range of motion of the left knee. No lower extremity edema.  No joint effusions. Neurologic:  Normal speech and language. No gross focal neurologic deficits are appreciated.  Skin:  4 cm oblong area of blue ecchymosis with mild swelling noted about the middle portion of the anterior left lower leg. Area is tender to palpation. Skin is warm, dry and intact. No rash, open wounds, lacerations noted. Psychiatric: Mood and affect are normal. Speech and behavior are normal. Patient exhibits appropriate insight and judgement.   ____________________________________________    LABS  None ____________________________________________  EKG  None ____________________________________________  RADIOLOGY I have personally viewed and evaluated these images (plain radiographs) as part of my medical decision making, as well as reviewing the written report by the radiologist.  Dg Tibia/fibula Left  06/08/2015  CLINICAL DATA:  Blunt trauma during basketball game. Initial encounter. EXAM: LEFT TIBIA AND FIBULA - 2 VIEW COMPARISON:  None. FINDINGS: Pretibial swelling at the mid shin.  No fracture or foreign body. IMPRESSION: Shin swelling without osseous abnormality. Electronically Signed   By: Marnee Spring M.D.   On: 06/08/2015 22:50    ____________________________________________    PROCEDURES  Procedure(s) performed: None    Medications  oxyCODONE-acetaminophen (PERCOCET/ROXICET) 5-325 MG per tablet 1 tablet (1 tablet Oral Given 06/08/15 2232)     ____________________________________________   INITIAL IMPRESSION / ASSESSMENT AND PLAN / ED COURSE  Pertinent imaging results that were available during my care of the patient were reviewed by me and considered in my medical decision making (see chart for details).  Patient's diagnosis is consistent with contusion of left lower leg. Patient was placed in an Ace wrap. Patient will be discharged home with prescriptions for ibuprofen. Patient advised to apply ice to the left lower leg 20 minutes 3-4 times daily and keep area elevated when not ambulating. Patient is to follow up with [facility] community clinic if symptoms persist past this treatment course. Patient is given ED precautions to return to the ED for any worsening or new symptoms.    ____________________________________________  FINAL CLINICAL IMPRESSION(S) / ED DIAGNOSES  Final diagnoses:  Contusion of left lower leg, initial encounter      NEW MEDICATIONS STARTED DURING THIS VISIT:  New Prescriptions   IBUPROFEN (ADVIL,MOTRIN) 600 MG  TABLET    Take 1 tablet (600 mg total) by mouth every 6 (six) hours as needed.         Hope Pigeon, PA-C 06/08/15 2253  Sharyn Creamer, MD 06/09/15 [phone]

## 2015-06-14 ENCOUNTER — Encounter: Payer: Self-pay | Admitting: Emergency Medicine

## 2015-06-14 ENCOUNTER — Emergency Department
Admission: EM | Admit: 2015-06-14 | Discharge: 2015-06-14 | Disposition: A | Discharge status: Home / Self Care | Payer: Self-pay | Attending: Emergency Medicine | Admitting: Emergency Medicine

## 2015-06-14 DIAGNOSIS — Z791 Long term (current) use of non-steroidal anti-inflammatories (NSAID): Secondary | ICD-10-CM | POA: Insufficient documentation

## 2015-06-14 DIAGNOSIS — J40 Bronchitis, not specified as acute or chronic: Secondary | ICD-10-CM | POA: Insufficient documentation

## 2015-06-14 DIAGNOSIS — F319 Bipolar disorder, unspecified: Secondary | ICD-10-CM | POA: Insufficient documentation

## 2015-06-14 DIAGNOSIS — F17210 Nicotine dependence, cigarettes, uncomplicated: Secondary | ICD-10-CM | POA: Insufficient documentation

## 2015-06-14 MED ORDER — IBUPROFEN 800 MG PO TABS: 800.0000 mg | ORAL_TABLET | Freq: Three times a day (TID) | ORAL | Status: DC | PRN

## 2015-06-14 MED ORDER — HYDROCOD POLST-CPM POLST ER 10-8 MG/5ML PO SUER: 5.0000 mL | Freq: Two times a day (BID) | ORAL | Status: DC

## 2015-06-14 MED ORDER — HYDROCOD POLST-CPM POLST ER 10-8 MG/5ML PO SUER
5.0000 mL | Freq: Once | ORAL | Status: AC
  Administered 2015-06-14: 5 mL via ORAL
  Filled 2015-06-14: qty 5

## 2015-06-14 MED ORDER — SULFAMETHOXAZOLE-TRIMETHOPRIM 800-160 MG PO TABS: 1.0000 | ORAL_TABLET | Freq: Two times a day (BID) | ORAL | Status: DC

## 2015-06-14 NOTE — ED Provider Notes (Signed)
[facility] Emergency Department Provider Note   ____________________________________________  Time seen: Approximately 3:03 PM  I have reviewed the triage vital signs and the nursing notes.   HISTORY  Chief Complaint Cough    HPI Briana Bradley is a 25 y.o. female patient complaining of cough and fever for 2 days. Pacing acute onset of a burning sensation in her chest secondary to coughing. She states the cough is productive and greenish in nature. Patient denies any nausea vomiting or diarrhea associated this complaint.   Past Medical History  Diagnosis Date  . Asthma   . Bipolar 1 disorder (HCC)     There are no active problems to display for this patient.   History reviewed. No pertinent past surgical history.  Current Outpatient Rx  Name  Route  Sig  Dispense  Refill  . albuterol (PROVENTIL HFA;VENTOLIN HFA) 108 (90 BASE) MCG/ACT inhaler   Inhalation   Inhale 2 puffs into the lungs every 6 (six) hours as needed for wheezing or shortness of breath.         . chlorpheniramine-HYDROcodone (TUSSIONEX PENNKINETIC ER) 10-8 MG/5ML SUER   Oral   Take 5 mLs by mouth 2 (two) times daily.   115 mL   0   . ibuprofen (ADVIL,MOTRIN) 600 MG tablet   Oral   Take 1 tablet (600 mg total) by mouth every 6 (six) hours as needed.   30 tablet   0   . ibuprofen (ADVIL,MOTRIN) 800 MG tablet   Oral   Take 1 tablet (800 mg total) by mouth every 8 (eight) hours as needed.   30 tablet   0   . meclizine (ANTIVERT) 25 MG tablet   Oral   Take 1 tablet (25 mg total) by mouth 3 (three) times daily as needed for dizziness.   30 tablet   0   . ondansetron (ZOFRAN ODT) 4 MG disintegrating tablet   Oral   Take 1 tablet (4 mg total) by mouth every 6 (six) hours as needed for nausea or vomiting.   20 tablet   0   . sulfamethoxazole-trimethoprim (BACTRIM DS,SEPTRA DS) 800-160 MG tablet   Oral   Take 1 tablet by mouth 2 (two) times daily.   20 tablet   0     Allergies Review of patient's allergies indicates no known allergies.  History reviewed. No pertinent family history.  Social History Social History  Substance Use Topics  . Smoking status: Current Every Day Smoker -- 1.00 packs/day    Types: Cigarettes  . Smokeless tobacco: None  . Alcohol Use: No    Review of Systems Constitutional: Fever and chills  Eyes: No visual changes. ENT: No sore throat. Cardiovascular: Denies chest pain. Respiratory: Denies shortness of breath. It doesn't a cough Gastrointestinal: No abdominal pain.  No nausea, no vomiting.  No diarrhea.  No constipation. Genitourinary: Negative for dysuria. Musculoskeletal: Negative for back pain. Skin: Negative for rash. Neurological: Negative for headaches, focal weakness or numbness.    ____________________________________________   PHYSICAL EXAM:  VITAL SIGNS: ED Triage Vitals  Enc Vitals Group     BP 06/14/15 1446 138/70 mmHg     Pulse Rate 06/14/15 1446 73     Resp 06/14/15 1446 20     Temp 06/14/15 1446 99.1 F (37.3 C)     Temp Source 06/14/15 1446 Oral     SpO2 06/14/15 1446 96 %     Weight 06/14/15 1446 165 lb (74.844 kg)  Height 06/14/15 1446  (1.702 m)     Head Cir --      Peak Flow --      Pain Score 06/14/15 1447 0     Pain Loc --      Pain Edu? --      Excl. in GC? --     Constitutional: Alert and oriented. Well appearing and in no acute distress. Eyes: Conjunctivae are normal. PERRL. EOMI. Head: Atraumatic. Nose: No congestion/rhinnorhea. Mouth/Throat: Mucous membranes are moist.  Oropharynx non-erythematous. Neck: No stridor.  No cervical spine tenderness to palpation. Hematological/Lymphatic/Immunilogical: No cervical lymphadenopathy. Cardiovascular: Normal rate, regular rhythm. Grossly normal heart sounds.  Good peripheral circulation. Respiratory: Normal respiratory effort.  No retractions. Lungs bilateral Rales with productive cough Gastrointestinal: Soft  and nontender. No distention. No abdominal bruits. No CVA tenderness. Musculoskeletal: No lower extremity tenderness nor edema.  No joint effusions. Neurologic:  Normal speech and language. No gross focal neurologic deficits are appreciated. No gait instability. Skin:  Skin is warm, dry and intact. No rash noted. Psychiatric: Mood and affect are normal. Speech and behavior are normal.  ____________________________________________   LABS (all labs ordered are listed, but only abnormal results are displayed)  Labs Reviewed - No data to display ____________________________________________  EKG   ____________________________________________  RADIOLOGY   ____________________________________________   PROCEDURES  Procedure(s) performed: None  Critical Care performed: No  ____________________________________________   INITIAL IMPRESSION / ASSESSMENT AND PLAN / ED COURSE  Pertinent labs & imaging results that were available during my care of the patient were reviewed by me and considered in my medical decision making (see chart for details).  Bronchitis . Patient given discharge care instructions. Patient given a prescription for Tussionex, Bactrim DS, and ibuprofen. Patient given a work note and advised to follow "clinic if no improvement. ____________________________________________   FINAL CLINICAL IMPRESSION(S) / ED DIAGNOSES  Final diagnoses:  Bronchitis      NEW MEDICATIONS STARTED DURING THIS VISIT:  New Prescriptions   CHLORPHENIRAMINE-HYDROCODONE (TUSSIONEX PENNKINETIC ER) 10-8 MG/5ML SUER    Take 5 mLs by mouth 2 (two) times daily.   IBUPROFEN (ADVIL,MOTRIN) 800 MG TABLET    Take 1 tablet (800 mg total) by mouth every 8 (eight) hours as needed.   SULFAMETHOXAZOLE-TRIMETHOPRIM (BACTRIM DS,SEPTRA DS) 800-160 MG TABLET    Take 1 tablet by mouth 2 (two) times daily.     Note:  This document was prepared using Dragon voice recognition software and may include  unintentional dictation errors.    Joni Reining, PA-C 06/14/15 1534  Jene Every, MD 06/14/15 1540

## 2015-06-14 NOTE — ED Notes (Signed)
Pt to ed with c/o cough, congestion, fever x 1 day.

## 2015-10-18 ENCOUNTER — Encounter: Payer: Self-pay | Admitting: Emergency Medicine

## 2015-10-18 ENCOUNTER — Emergency Department
Admission: EM | Admit: 2015-10-18 | Discharge: 2015-10-18 | Disposition: A | Discharge status: Home / Self Care | Payer: Self-pay | Attending: Emergency Medicine | Admitting: Emergency Medicine

## 2015-10-18 DIAGNOSIS — Z5321 Procedure and treatment not carried out due to patient leaving prior to being seen by health care provider: Secondary | ICD-10-CM | POA: Insufficient documentation

## 2015-10-18 DIAGNOSIS — J45909 Unspecified asthma, uncomplicated: Secondary | ICD-10-CM | POA: Insufficient documentation

## 2015-10-18 DIAGNOSIS — F17210 Nicotine dependence, cigarettes, uncomplicated: Secondary | ICD-10-CM | POA: Insufficient documentation

## 2015-10-18 DIAGNOSIS — G43909 Migraine, unspecified, not intractable, without status migrainosus: Secondary | ICD-10-CM | POA: Insufficient documentation

## 2015-10-18 DIAGNOSIS — M542 Cervicalgia: Secondary | ICD-10-CM | POA: Insufficient documentation

## 2015-10-18 NOTE — ED Triage Notes (Signed)
Patient states that she was at softball practice and pulled a muscle in her neck. Patient reports that she is having pan to the right side of her neck down to her shoulder and that the pain is giving her a migraine.

## 2016-05-18 ENCOUNTER — Encounter: Payer: Self-pay | Admitting: Emergency Medicine

## 2016-05-18 DIAGNOSIS — Z79899 Other long term (current) drug therapy: Secondary | ICD-10-CM | POA: Insufficient documentation

## 2016-05-18 DIAGNOSIS — W450XXA Nail entering through skin, initial encounter: Secondary | ICD-10-CM | POA: Insufficient documentation

## 2016-05-18 DIAGNOSIS — Y999 Unspecified external cause status: Secondary | ICD-10-CM | POA: Insufficient documentation

## 2016-05-18 DIAGNOSIS — J45909 Unspecified asthma, uncomplicated: Secondary | ICD-10-CM | POA: Insufficient documentation

## 2016-05-18 DIAGNOSIS — Y929 Unspecified place or not applicable: Secondary | ICD-10-CM | POA: Insufficient documentation

## 2016-05-18 DIAGNOSIS — F17210 Nicotine dependence, cigarettes, uncomplicated: Secondary | ICD-10-CM | POA: Insufficient documentation

## 2016-05-18 DIAGNOSIS — S90822A Blister (nonthermal), left foot, initial encounter: Secondary | ICD-10-CM | POA: Insufficient documentation

## 2016-05-18 DIAGNOSIS — Z791 Long term (current) use of non-steroidal anti-inflammatories (NSAID): Secondary | ICD-10-CM | POA: Insufficient documentation

## 2016-05-18 DIAGNOSIS — Y9389 Activity, other specified: Secondary | ICD-10-CM | POA: Insufficient documentation

## 2016-05-18 NOTE — ED Triage Notes (Signed)
Pt says she stepped on a new nail about 5 months ago and was not evaluated for injury; has had a calloused area form where the nail punctured her left great toe; in the last several days she's noticed pressure to the area with intermittent sharp pain; she did take and pin or needle and open the area in the last day; noticed yellow/green drainage; denies fever; pt in no acute distress; ambulatory with steady gait

## 2016-05-19 ENCOUNTER — Emergency Department
Admission: EM | Admit: 2016-05-19 | Discharge: 2016-05-19 | Disposition: A | Discharge status: Home / Self Care | Payer: Self-pay | Attending: Emergency Medicine | Admitting: Emergency Medicine

## 2016-05-19 ENCOUNTER — Emergency Department: Payer: Self-pay

## 2016-05-19 DIAGNOSIS — L089 Local infection of the skin and subcutaneous tissue, unspecified: Secondary | ICD-10-CM

## 2016-05-19 DIAGNOSIS — S90425A Blister (nonthermal), left lesser toe(s), initial encounter: Secondary | ICD-10-CM

## 2016-05-19 MED ORDER — CIPROFLOXACIN HCL 500 MG PO TABS
500.0000 mg | ORAL_TABLET | Freq: Once | ORAL | Status: AC
  Administered 2016-05-19: 500 mg via ORAL
  Filled 2016-05-19: qty 1

## 2016-05-19 MED ORDER — CIPROFLOXACIN HCL 500 MG PO TABS: 500.0000 mg | ORAL_TABLET | Freq: Two times a day (BID) | ORAL | 0 refills | Status: AC

## 2016-05-19 NOTE — ED Provider Notes (Signed)
[facility] Emergency Department Provider Note   ____________________________________________   First MD Initiated Contact with Patient 05/19/16 [phone]     (approximate)  I have reviewed the triage vital signs and the nursing notes.   HISTORY  Chief Complaint Foot Pain    HPI Briana Bradley is a 26 y.o. female who comes into the hospital today with some left toe pain. The patient reports that 5 months ago she stepped on a nail. She didn't get seen about it but reports that it developed a callus and is been swollen. She reports that she's had some occasional shooting pain from her toe up to her leg. She reports that yesterday she took a needle and took off the top of it and there was some yellowish greenish drainage. The patient reports that her pain currently is a 5 out of 10 in intensity but she is concerned about a possible infection. She isn't had any fevers nausea vomiting or any other complaints. She is just concerned about her toe.   Past Medical History:  Diagnosis Date  . Asthma   . Bipolar 1 disorder (HCC)     There are no active problems to display for this patient.   History reviewed. No pertinent surgical history.  Prior to Admission medications   Medication Sig Start Date End Date Taking? Authorizing Provider  albuterol (PROVENTIL HFA;VENTOLIN HFA) 108 (90 BASE) MCG/ACT inhaler Inhale 2 puffs into the lungs every 6 (six) hours as needed for wheezing or shortness of breath.   Yes Historical Provider, MD  ibuprofen (ADVIL,MOTRIN) 600 MG tablet Take 1 tablet (600 mg total) by mouth every 6 (six) hours as needed. 06/08/15  Yes Jami L Hagler, PA-C  ibuprofen (ADVIL,MOTRIN) 800 MG tablet Take 1 tablet (800 mg total) by mouth every 8 (eight) hours as needed. 06/14/15  Yes Joni Reining, PA-C  meclizine (ANTIVERT) 25 MG tablet Take 1 tablet (25 mg total) by mouth 3 (three) times daily as needed for dizziness. 06/02/15  Yes Sharyn Creamer, MD  ondansetron  (ZOFRAN ODT) 4 MG disintegrating tablet Take 1 tablet (4 mg total) by mouth every 6 (six) hours as needed for nausea or vomiting. 06/02/15  Yes Sharyn Creamer, MD  ciprofloxacin (CIPRO) 500 MG tablet Take 1 tablet (500 mg total) by mouth 2 (two) times daily. 05/19/16 05/26/16  Rebecka Apley, MD    Allergies Patient has no known allergies.  History reviewed. No pertinent family history.  Social History Social History  Substance Use Topics  . Smoking status: Current Every Day Smoker    Types: Cigarettes  . Smokeless tobacco: Never Used  . Alcohol use No    Review of Systems Constitutional: No fever/chills Eyes: No visual changes. ENT: No sore throat. Cardiovascular: Denies chest pain. Respiratory: Denies shortness of breath. Gastrointestinal: No abdominal pain.  No nausea, no vomiting.  No diarrhea.  No constipation. Genitourinary: Negative for dysuria. Musculoskeletal: Negative for back pain. Skin: Callus to plantar surface of left great toe Neurological: Negative for headaches, focal weakness or numbness.  10-point ROS otherwise negative.  ____________________________________________   PHYSICAL EXAM:  VITAL SIGNS: ED Triage Vitals  Enc Vitals Group     BP 05/18/16 2348 126/84     Pulse Rate 05/18/16 2348 74     Resp 05/18/16 2348 18     Temp 05/18/16 2348 98.4 F (36.9 C)     Temp Source 05/18/16 2348 Oral     SpO2 05/18/16 2348 98 %  Weight 05/18/16 2348 165 lb (74.8 kg)     Height 05/18/16 2348  (1.676 m)     Head Circumference --      Peak Flow --      Pain Score 05/18/16 2350 6     Pain Loc --      Pain Edu? --      Excl. in GC? --     Constitutional: Alert and oriented. Well appearing and in mild distress. Eyes: Conjunctivae are normal. PERRL. EOMI. Head: Atraumatic. Nose: No congestion/rhinnorhea. Mouth/Throat: Mucous membranes are moist.  Oropharynx non-erythematous. Cardiovascular: Normal rate, regular rhythm. Grossly normal heart sounds.   Good peripheral circulation. Respiratory: Normal respiratory effort.  No retractions. Lungs CTAB. Gastrointestinal: Soft and nontender. No distention. none Musculoskeletal: No lower extremity tenderness nor edema.   Neurologic:  Normal speech and language. Skin:  Skin is warm, dry and intact. Callus to left great toe with some mild erythema and blister Psychiatric: Mood and affect are normal.   ____________________________________________   LABS (all labs ordered are listed, but only abnormal results are displayed)  Labs Reviewed - No data to display ____________________________________________  EKG  none ____________________________________________  RADIOLOGY  Left toe pain ____________________________________________   PROCEDURES  Procedure(s) performed: None  Procedures  Critical Care performed: No  ____________________________________________   INITIAL IMPRESSION / ASSESSMENT AND PLAN / ED COURSE  Pertinent labs & imaging results that were available during my care of the patient were reviewed by me and considered in my medical decision making (see chart for details).  This is a 26 year old female who comes into the hospital today with a wound to her great toe although this wound is remote she does still have some tenderness and what appears to be a blister. She also has some redness to her toe. I'll give the patient some ciprofloxacin and she'll be discharged home. She did receive an x-ray which did not show any osteomyelitis. The patient's follow-up with the acute care clinic.  Clinical Course as of May 20 426  Mon May 19, 2016  1610 Soft tissue puncture wound/ulceration along the plantar aspect of the great toe. No radiographic evidence of osteomyelitis is seen.   DG Toe Great Left [AW]    Clinical Course User Index [AW] Rebecka Apley, MD     ____________________________________________   FINAL CLINICAL IMPRESSION(S) / ED DIAGNOSES  Final  diagnoses:  Blister of toe of left foot with infection, initial encounter      NEW MEDICATIONS STARTED DURING THIS VISIT:  Discharge Medication List as of 05/19/2016  4:09 AM    START taking these medications   Details  ciprofloxacin (CIPRO) 500 MG tablet Take 1 tablet (500 mg total) by mouth 2 (two) times daily., Starting Mon 05/19/2016, Until Mon 05/26/2016, Print         Note:  This document was prepared using Dragon voice recognition software and may include unintentional dictation errors.    Rebecka Apley, MD 05/19/16 [phone]

## 2016-05-19 NOTE — Discharge Instructions (Signed)
Please follow up with your primary care physician.

## 2016-11-16 ENCOUNTER — Emergency Department: Payer: Self-pay

## 2016-11-16 ENCOUNTER — Encounter: Payer: Self-pay | Admitting: Emergency Medicine

## 2016-11-16 ENCOUNTER — Emergency Department
Admission: EM | Admit: 2016-11-16 | Discharge: 2016-11-16 | Disposition: A | Discharge status: Home / Self Care | Payer: Self-pay | Attending: Emergency Medicine | Admitting: Emergency Medicine

## 2016-11-16 DIAGNOSIS — R112 Nausea with vomiting, unspecified: Secondary | ICD-10-CM | POA: Insufficient documentation

## 2016-11-16 DIAGNOSIS — F17210 Nicotine dependence, cigarettes, uncomplicated: Secondary | ICD-10-CM | POA: Insufficient documentation

## 2016-11-16 DIAGNOSIS — Z79899 Other long term (current) drug therapy: Secondary | ICD-10-CM | POA: Insufficient documentation

## 2016-11-16 DIAGNOSIS — R0981 Nasal congestion: Secondary | ICD-10-CM | POA: Insufficient documentation

## 2016-11-16 DIAGNOSIS — J40 Bronchitis, not specified as acute or chronic: Secondary | ICD-10-CM | POA: Insufficient documentation

## 2016-11-16 DIAGNOSIS — J45909 Unspecified asthma, uncomplicated: Secondary | ICD-10-CM | POA: Insufficient documentation

## 2016-11-16 DIAGNOSIS — R51 Headache: Secondary | ICD-10-CM | POA: Insufficient documentation

## 2016-11-16 DIAGNOSIS — R05 Cough: Secondary | ICD-10-CM | POA: Insufficient documentation

## 2016-11-16 MED ORDER — ONDANSETRON 8 MG PO TBDP
8.0000 mg | ORAL_TABLET | Freq: Once | ORAL | Status: AC
  Administered 2016-11-16: 8 mg via ORAL
  Filled 2016-11-16: qty 1

## 2016-11-16 MED ORDER — FEXOFENADINE-PSEUDOEPHED ER 60-120 MG PO TB12: 1.0000 | ORAL_TABLET | Freq: Two times a day (BID) | ORAL | 0 refills | Status: DC

## 2016-11-16 MED ORDER — BENZONATATE 100 MG PO CAPS: 200.0000 mg | ORAL_CAPSULE | Freq: Three times a day (TID) | ORAL | 0 refills | Status: AC | PRN

## 2016-11-16 MED ORDER — AZITHROMYCIN 250 MG PO TABS: ORAL_TABLET | ORAL | 0 refills | Status: AC

## 2016-11-16 NOTE — ED Provider Notes (Signed)
[facility] Emergency Department Provider Note   ____________________________________________   First MD Initiated Contact with Patient 11/16/16 1500     (approximate)  I have reviewed the triage vital signs and the nursing notes.   HISTORY  Chief Complaint Nasal Congestion and Cough    HPI Briana Bradley is a 26 y.o. female patient's complaint of cold symptoms for  Weeks. Patient states  past week with productive greenish cough. Right ear pressure, nasal congestion, frontal headache,nausea ,and vomiting. Patient denies diarrheapatient rates pain as a 9/10.   Past Medical History:  Diagnosis Date  . Asthma   . Bipolar 1 disorder (HCC)     There are no active problems to display for this patient.   History reviewed. No pertinent surgical history.  Prior to Admission medications   Medication Sig Start Date End Date Taking? Authorizing Provider  albuterol (PROVENTIL HFA;VENTOLIN HFA) 108 (90 BASE) MCG/ACT inhaler Inhale 2 puffs into the lungs every 6 (six) hours as needed for wheezing or shortness of breath.    [provider]  azithromycin (ZITHROMAX Z-PAK) 250 MG tablet Take 2 tablets (500 mg) on  Day 1,  followed by 1 tablet (250 mg) once daily on Days 2 through 5. 11/16/16 11/21/16  Joni Reining, PA-C  benzonatate (TESSALON PERLES) 100 MG capsule Take 2 capsules (200 mg total) by mouth 3 (three) times daily as needed for cough. 11/16/16 11/16/17  Joni Reining, PA-C  fexofenadine-pseudoephedrine (ALLEGRA-D) 60-120 MG 12 hr tablet Take 1 tablet by mouth 2 (two) times daily. 11/16/16   Joni Reining, PA-C  ibuprofen (ADVIL,MOTRIN) 600 MG tablet Take 1 tablet (600 mg total) by mouth every 6 (six) hours as needed. 06/08/15   Hagler, Jami L, PA-C  ibuprofen (ADVIL,MOTRIN) 800 MG tablet Take 1 tablet (800 mg total) by mouth every 8 (eight) hours as needed. 06/14/15   Joni Reining, PA-C  meclizine (ANTIVERT) 25 MG tablet Take 1 tablet (25 mg  total) by mouth 3 (three) times daily as needed for dizziness. 06/02/15   Sharyn Creamer, MD  ondansetron (ZOFRAN ODT) 4 MG disintegrating tablet Take 1 tablet (4 mg total) by mouth every 6 (six) hours as needed for nausea or vomiting. 06/02/15   Sharyn Creamer, MD    Allergies Patient has no known allergies.  No family history on file.  Social History Social History  Substance Use Topics  . Smoking status: Current Every Day Smoker    Types: Cigarettes  . Smokeless tobacco: Never Used  . Alcohol use No    Review of Systems Constitutional: No fever/chills Eyes: No visual changes. ENT: nasal congestion Cardiovascular: Denies chest pain. Respiratoryproductivecough Gastrointestinal: ausea and vomit without diarrhea. Genitourinary: Negative for dysuria. Musculoskeletal: Negative for back pain. Skin: Negative for rash. Neurological: frontal headaches, but denies focal weakness or numbness.  ____________________________________________   PHYSICAL EXAM:  VITAL SIGNS: ED Triage Vitals  Enc Vitals Group     BP 11/16/16 1442 (!) 147/83     Pulse Rate 11/16/16 1442 72     Resp 11/16/16 1442 16     Temp 11/16/16 1442 98.1 F (36.7 C)     Temp Source 11/16/16 1442 Oral     SpO2 11/16/16 1442 99 %     Weight 11/16/16 1442 160 lb (72.6 kg)     Height 11/16/16 1442  (1.676 m)     Head Circumference --      Peak Flow --  Pain Score 11/16/16 1441 9     Pain Loc --      Pain Edu? --      Excl. in GC? --     Constitutional: Alert and oriented. Well appearing and in no acute distress. Nose:Edematous nasal turbinates with bilateral maxillary guarding. Mouth/Throat: Mucous membranes are moist.  Oropharynx non-erythematous. Postnasal drainage Neck: No stridor. No cervical spine tenderness to palpation. Hematological/Lymphatic/Immunilogical: No cervical lymphadenopathy. Cardiovascular: Normal rate, regular rhythm. Grossly normal heart sounds.  Good peripheral  circulation. Respiratory: Normal respiratory effort.  No retractions. Lungs CTAB. Gastrointestinal: Soft and nontender. No distention. No abdominal bruits. No CVA tenderness. Musculoskeletal: No lower extremity tenderness nor edema.  No joint effusions. Neurologic:  Normal speech and language. No gross focal neurologic deficits are appreciated. No gait instability. Skin:  Skin is warm, dry and intact. No rash noted. Psychiatric: Mood and affect are normal. Speech and behavior are normal.  ____________________________________________   LABS (all labs ordered are listed, but only abnormal results are displayed)  Labs Reviewed - No data to display ____________________________________________  EKG   ____________________________________________  RADIOLOGY  Dg Chest 2 View  Result Date: 11/16/2016 CLINICAL DATA:  Productive cough. Shortness of breath. Central chest pain. EXAM: CHEST  2 VIEW COMPARISON:  None. FINDINGS: Cardiomediastinal silhouette is normal. I think there is mild central bronchial thickening but no infiltrate, collapse or effusion. Lung volumes are normal. No bone abnormality. IMPRESSION: Mild bronchitis pattern.  No consolidation or collapse. Electronically Signed   By: Paulina Fusi M.D.   On: 11/16/2016 15:34    X-ray findings consistent with bronchitis_________________________   PROCEDURES  Procedure(s) performed: None  Procedures  Critical Care performed: No  ____________________________________________   INITIAL IMPRESSION / ASSESSMENT AND PLAN / ED COURSE  @  Patient presents to URI signs and symptoms.Patient physical finding consisten with nasal costion. Discussed x-ray finding with patient consistent with bronchitis.patient given disctions. Patient given a prescription for Zithromax, fexofenadine and Tessalon. Patient given a work note. Patient passed [facility].       ____________________________________________   FINAL CLINICAL IMPRESSION(S) / ED DIAGNOSES  Final diagnoses:  Nasal congestion  Bronchitis      NEW MEDICATIONS STARTED DURING THIS VISIT:  New Prescriptions   AZITHROMYCIN (ZITHROMAX Z-PAK) 250 MG TABLET    Take 2 tablets (500 mg) on  Day 1,  followed by 1 tablet (250 mg) once daily on Days 2 through 5.   BENZONATATE (TESSALON PERLES) 100 MG CAPSULE    Take 2 capsules (200 mg total) by mouth 3 (three) times daily as needed for cough.   FEXOFENADINE-PSEUDOEPHEDRINE (ALLEGRA-D) 60-120 MG 12 HR TABLET    Take 1 tablet by mouth 2 (two) times daily.     Note:  This document was prepared using Dragon voice recognition software and may include unintentional dictation errors.    Joni Reining, PA-C 11/16/16 1546    Sharman Cheek, MD 11/16/16 2308

## 2016-11-16 NOTE — ED Notes (Signed)
See triage note  Presents with cold sx's  Sinus drainage and right ear pain  States sx's started about 3-4 days ago  Unsure of fever

## 2016-11-16 NOTE — ED Triage Notes (Signed)
Pt to ED via POV with c/o cold like symptoms x2wks. Denies fever. Pt VS stable

## 2017-06-08 ENCOUNTER — Encounter: Payer: Self-pay | Admitting: Emergency Medicine

## 2017-06-08 ENCOUNTER — Emergency Department
Admission: EM | Admit: 2017-06-08 | Discharge: 2017-06-08 | Disposition: A | Discharge status: Home / Self Care | Payer: Self-pay | Attending: Emergency Medicine | Admitting: Emergency Medicine

## 2017-06-08 DIAGNOSIS — T1592XA Foreign body on external eye, part unspecified, left eye, initial encounter: Secondary | ICD-10-CM

## 2017-06-08 DIAGNOSIS — Y999 Unspecified external cause status: Secondary | ICD-10-CM | POA: Insufficient documentation

## 2017-06-08 DIAGNOSIS — Y929 Unspecified place or not applicable: Secondary | ICD-10-CM | POA: Insufficient documentation

## 2017-06-08 DIAGNOSIS — T1512XA Foreign body in conjunctival sac, left eye, initial encounter: Secondary | ICD-10-CM | POA: Insufficient documentation

## 2017-06-08 DIAGNOSIS — W268XXA Contact with other sharp object(s), not elsewhere classified, initial encounter: Secondary | ICD-10-CM | POA: Insufficient documentation

## 2017-06-08 DIAGNOSIS — Z79899 Other long term (current) drug therapy: Secondary | ICD-10-CM | POA: Insufficient documentation

## 2017-06-08 DIAGNOSIS — J45909 Unspecified asthma, uncomplicated: Secondary | ICD-10-CM | POA: Insufficient documentation

## 2017-06-08 DIAGNOSIS — Y9389 Activity, other specified: Secondary | ICD-10-CM | POA: Insufficient documentation

## 2017-06-08 DIAGNOSIS — F17210 Nicotine dependence, cigarettes, uncomplicated: Secondary | ICD-10-CM | POA: Insufficient documentation

## 2017-06-08 MED ORDER — FLUORESCEIN SODIUM 1 MG OP STRP
1.0000 | ORAL_STRIP | Freq: Once | OPHTHALMIC | Status: AC
  Administered 2017-06-08: 1 via OPHTHALMIC
  Filled 2017-06-08: qty 1

## 2017-06-08 MED ORDER — TETRACAINE HCL 0.5 % OP SOLN
2.0000 [drp] | Freq: Once | OPHTHALMIC | Status: AC
  Administered 2017-06-08: 2 [drp] via OPHTHALMIC
  Filled 2017-06-08: qty 4

## 2017-06-08 MED ORDER — POLYMYXIN B-TRIMETHOPRIM 10000-0.1 UNIT/ML-% OP SOLN: 2.0000 [drp] | Freq: Four times a day (QID) | OPHTHALMIC | 0 refills | Status: DC

## 2017-06-08 MED ORDER — KETOROLAC TROMETHAMINE 0.5 % OP SOLN: 1.0000 [drp] | Freq: Four times a day (QID) | OPHTHALMIC | 0 refills | Status: DC

## 2017-06-08 NOTE — ED Provider Notes (Signed)
[facility] Emergency Department Provider Note  ____________________________________________  Time seen: Approximately 8:50 PM  I have reviewed the triage vital signs and the nursing notes.   HISTORY  Chief Complaint Foreign Body in Eye    HPI Briana Bradley is a 27 y.o. female who presents the emergency department complaining of foreign body to her left eye.  Patient reports that she was grinding a piece of sheet metal when she believes a piece flew into her eye.  Patient has tried flushing her eye multiple times and continues to have a foreign body sensation.  Patient does not wear glasses or contacts.  No visual changes.  No other injury or complaint.  Other than saline eyedrops, no medication prior to arrival.  Past Medical History:  Diagnosis Date  . Asthma   . Bipolar 1 disorder (HCC)     There are no active problems to display for this patient.   History reviewed. No pertinent surgical history.  Prior to Admission medications   Medication Sig Start Date End Date Taking? Authorizing Provider  albuterol (PROVENTIL HFA;VENTOLIN HFA) 108 (90 BASE) MCG/ACT inhaler Inhale 2 puffs into the lungs every 6 (six) hours as needed for wheezing or shortness of breath.    [provider]  benzonatate (TESSALON PERLES) 100 MG capsule Take 2 capsules (200 mg total) by mouth 3 (three) times daily as needed for cough. 11/16/16 11/16/17  Joni Reining, PA-C  fexofenadine-pseudoephedrine (ALLEGRA-D) 60-120 MG 12 hr tablet Take 1 tablet by mouth 2 (two) times daily. 11/16/16   Joni Reining, PA-C  ibuprofen (ADVIL,MOTRIN) 600 MG tablet Take 1 tablet (600 mg total) by mouth every 6 (six) hours as needed. 06/08/15   Hagler, Jami L, PA-C  ibuprofen (ADVIL,MOTRIN) 800 MG tablet Take 1 tablet (800 mg total) by mouth every 8 (eight) hours as needed. 06/14/15   Joni Reining, PA-C  ketorolac (ACULAR) 0.5 % ophthalmic solution Place 1 drop into the left eye 4 (four)  times daily. 06/08/17   Tegan Burnside, Delorise Royals, PA-C  meclizine (ANTIVERT) 25 MG tablet Take 1 tablet (25 mg total) by mouth 3 (three) times daily as needed for dizziness. 06/02/15   Sharyn Creamer, MD  ondansetron (ZOFRAN ODT) 4 MG disintegrating tablet Take 1 tablet (4 mg total) by mouth every 6 (six) hours as needed for nausea or vomiting. 06/02/15   Sharyn Creamer, MD  trimethoprim-polymyxin b (POLYTRIM) ophthalmic solution Place 2 drops into the left eye every 6 (six) hours. 06/08/17   Chirstina Haan, Delorise Royals, PA-C    Allergies Patient has no known allergies.  History reviewed. No pertinent family history.  Social History Social History   Tobacco Use  . Smoking status: Current Every Day Smoker    Types: Cigarettes  . Smokeless tobacco: Never Used  Substance Use Topics  . Alcohol use: No  . Drug use: No     Review of Systems  Constitutional: No fever/chills Eyes: No visual changes. No discharge.  Possible foreign body to the left eye ENT: No upper respiratory complaints. Cardiovascular: no chest pain. Respiratory: no cough. No SOB. Gastrointestinal: No abdominal pain.  No nausea, no vomiting.  Musculoskeletal: Negative for musculoskeletal pain. Skin: Negative for rash, abrasions, lacerations, ecchymosis. Neurological: Negative for headaches, focal weakness or numbness. 10-point ROS otherwise negative.  ____________________________________________   PHYSICAL EXAM:  VITAL SIGNS: ED Triage Vitals  Enc Vitals Group     BP 06/08/17 2035 (!) 150/76     Pulse Rate 06/08/17 2035  93     Resp 06/08/17 2035 18     Temp 06/08/17 2035 98.4 F (36.9 C)     Temp Source 06/08/17 2035 Oral     SpO2 06/08/17 2035 96 %     Weight --      Height --      Head Circumference --      Peak Flow --      Pain Score 06/08/17 2031 3     Pain Loc --      Pain Edu? --      Excl. in GC? --      Constitutional: Alert and oriented. Well appearing and in no acute distress. Eyes: Conjunctivae are  normal. PERRL. EOMI. funduscopic exam reveals silver, metallic foreign body in the left eye in the 6 o'clock position over the iris.  Red reflex present.  Vasculature and optic disc appreciated.  No hyphema.  Eyes anesthetized with tetracaine drops, fluorescein staining applied.  No additional areas of uptake other than foreign body. Head: Atraumatic. ENT:      Ears:       Nose: No congestion/rhinnorhea.      Mouth/Throat: Mucous membranes are moist.  Neck: No stridor.    Cardiovascular: Normal rate, regular rhythm. Normal S1 and S2.  Good peripheral circulation. Respiratory: Normal respiratory effort without tachypnea or retractions. Lungs CTAB. Good air entry to the bases with no decreased or absent breath sounds. Musculoskeletal: Full range of motion to all extremities. No gross deformities appreciated. Neurologic:  Normal speech and language. No gross focal neurologic deficits are appreciated.  Skin:  Skin is warm, dry and intact. No rash noted. Psychiatric: Mood and affect are normal. Speech and behavior are normal. Patient exhibits appropriate insight and judgement.   ____________________________________________   LABS (all labs ordered are listed, but only abnormal results are displayed)  Labs Reviewed - No data to display ____________________________________________  EKG   ____________________________________________  RADIOLOGY   No results found.  ____________________________________________    PROCEDURES  Procedure(s) performed:    .Foreign Body Removal Date/Time: 06/08/2017 9:16 PM Performed by: Racheal Patches, PA-C Authorized by: Racheal Patches, PA-C  Consent: Verbal consent obtained. Risks and benefits: risks, benefits and alternatives were discussed Consent given by: patient Patient understanding: patient states understanding of the procedure being performed Patient identity confirmed: verbally with patient Body area: eye Location  details: left conjunctiva  Anesthesia: Local Anesthetic: tetracaine drops  Sedation: Patient sedated: no  Patient restrained: no Patient cooperative: yes Localization method: magnification and visualized Removal mechanism: 25-gauge needle and moist cotton swab Eye examined with fluorescein. Fluorescein uptake. Corneal abrasion size: small Corneal abrasion location: inferior No residual rust ring present. Dressing: antibiotic drops Depth: embedded Complexity: simple 1 objects recovered. Objects recovered: metal sliver Post-procedure assessment: foreign body removed Patient tolerance: Patient tolerated the procedure well with no immediate complications      Medications  fluorescein ophthalmic strip 1 strip (1 strip Left Eye Given by Other 06/08/17 2114)  tetracaine (PONTOCAINE) 0.5 % ophthalmic solution 2 drop (2 drops Left Eye Given by Other 06/08/17 2114)     ____________________________________________   INITIAL IMPRESSION / ASSESSMENT AND PLAN / ED COURSE  Pertinent labs & imaging results that were available during my care of the patient were reviewed by me and considered in my medical decision making (see chart for details).  Review of the [place] CSRS was performed in accordance of the [facility] prior to dispensing any controlled drugs.     Patient's  diagnosis is consistent with foreign body to the left eye.  Patient presented with foreign body sensation after grinding metal.  On exam, visible metallic foreign body is appreciated.  This is successfully removed.  Patient tolerated well.  No complications.. Patient will be discharged home with prescriptions for antibiotic eyedrops and Acular for symptom control. Patient is to follow up with ophthalmology as needed or otherwise directed. Patient is given ED precautions to return to the ED for any worsening or new symptoms.     ____________________________________________  FINAL CLINICAL IMPRESSION(S) / ED DIAGNOSES  Final  diagnoses:  Foreign body of left eye, initial encounter      NEW MEDICATIONS STARTED DURING THIS VISIT:  ED Discharge Orders        Ordered    trimethoprim-polymyxin b (POLYTRIM) ophthalmic solution  Every 6 hours     06/08/17 2152    ketorolac (ACULAR) 0.5 % ophthalmic solution  4 times daily     06/08/17 2152          This chart was dictated using voice recognition software/Dragon. Despite best efforts to proofread, errors can occur which can change the meaning. Any change was purely unintentional.    Racheal Patches, PA-C 06/08/17 2237    Minna Antis, MD 06/09/17 0010

## 2017-06-08 NOTE — ED Notes (Signed)
ED Provider at bedside. 

## 2017-06-08 NOTE — ED Triage Notes (Signed)
Pt reports she was drilling in metal 3 days ago and has had burning in the left eye since.

## 2017-10-11 ENCOUNTER — Emergency Department: Payer: Self-pay

## 2017-10-11 ENCOUNTER — Emergency Department
Admission: EM | Admit: 2017-10-11 | Discharge: 2017-10-11 | Disposition: A | Discharge status: Home / Self Care | Payer: Self-pay | Attending: Emergency Medicine | Admitting: Emergency Medicine

## 2017-10-11 DIAGNOSIS — F319 Bipolar disorder, unspecified: Secondary | ICD-10-CM | POA: Insufficient documentation

## 2017-10-11 DIAGNOSIS — F17210 Nicotine dependence, cigarettes, uncomplicated: Secondary | ICD-10-CM | POA: Insufficient documentation

## 2017-10-11 DIAGNOSIS — J45909 Unspecified asthma, uncomplicated: Secondary | ICD-10-CM | POA: Insufficient documentation

## 2017-10-11 DIAGNOSIS — J029 Acute pharyngitis, unspecified: Secondary | ICD-10-CM | POA: Insufficient documentation

## 2017-10-11 DIAGNOSIS — Z79899 Other long term (current) drug therapy: Secondary | ICD-10-CM | POA: Insufficient documentation

## 2017-10-11 LAB — CBC
HEMATOCRIT: 45.9 % (ref 35.0–47.0)
HEMOGLOBIN: 16.3 g/dL — AB (ref 12.0–16.0)
MCH: 34 pg (ref 26.0–34.0)
MCHC: 35.5 g/dL (ref 32.0–36.0)
MCV: 95.7 fL (ref 80.0–100.0)
Platelets: 264 10*3/uL (ref 150–440)
RBC: 4.8 MIL/uL (ref 3.80–5.20)
RDW: 13.2 % (ref 11.5–14.5)
WBC: 11.8 10*3/uL — AB (ref 3.6–11.0)

## 2017-10-11 LAB — BASIC METABOLIC PANEL
ANION GAP: 13 (ref 5–15)
BUN: 10 mg/dL (ref 6–20)
CHLORIDE: 103 mmol/L (ref 98–111)
CO2: 24 mmol/L (ref 22–32)
Calcium: 9.3 mg/dL (ref 8.9–10.3)
Creatinine, Ser: 0.75 mg/dL (ref 0.44–1.00)
GFR calc Af Amer: >60 mL/min (ref >60)
GLUCOSE: 82 mg/dL (ref 70–99)
POTASSIUM: 3.4 mmol/L — AB (ref 3.5–5.1)
Sodium: 140 mmol/L (ref 135–145)

## 2017-10-11 LAB — GROUP A STREP BY PCR: GROUP A STREP BY PCR: NOT DETECTED

## 2017-10-11 LAB — TSH: TSH: 0.591 u[IU]/mL (ref 0.350–4.500)

## 2017-10-11 MED ORDER — IOHEXOL 300 MG/ML  SOLN
75.0000 mL | Freq: Once | INTRAMUSCULAR | Status: AC | PRN
  Administered 2017-10-11: 75 mL via INTRAVENOUS
  Filled 2017-10-11: qty 75

## 2017-10-11 NOTE — ED Notes (Signed)
Pt reports sore throat and hoarseness x2 months. Reports not seeing medical provider due to lack of insurance.

## 2017-10-11 NOTE — ED Triage Notes (Signed)
Pt states her throat hurts and she cant swallow. Pt states she hasnt been able to eat as she is n/v. Pt states it started ago

## 2017-10-11 NOTE — ED Notes (Signed)
Pt has an IV in place; CT has been contacted.

## 2017-10-11 NOTE — Discharge Instructions (Addendum)
Please seek medical attention for any high fevers, chest pain, shortness of breath, change in behavior, persistent vomiting, bloody stool or any other new or concerning symptoms.  

## 2017-10-11 NOTE — ED Notes (Signed)
This RN called lab and spoke with Maralyn Sago (Research scientist (medical)) . Sarah confirmed TSH add on for this pt.

## 2017-10-11 NOTE — ED Provider Notes (Signed)
[facility] Emergency Department Provider Note  ____________________________________________   I have reviewed the triage vital signs and the nursing notes.   HISTORY  Chief Complaint Sore Throat   History limited by: Not Limited   HPI Briana Bradley is a 27 y.o. female who presents to the emergency department today because of concern for sore throat, difficulty swallowing and voice change.  She states that the symptoms started 2 months ago.  They have been progressive.  She states for the past 4 days she has not felt comfortable eating solid food.  She has noticed voice change during this time stating that it is been hoarse.  Patient denies any trauma to her neck or throat when the symptoms started.  Denies similar symptoms in the past.    Per medical record review patient has a history of asthma  Past Medical History:  Diagnosis Date  . Asthma   . Bipolar 1 disorder (HCC)     There are no active problems to display for this patient.   History reviewed. No pertinent surgical history.  Prior to Admission medications   Medication Sig Start Date End Date Taking? Authorizing Provider  albuterol (PROVENTIL HFA;VENTOLIN HFA) 108 (90 BASE) MCG/ACT inhaler Inhale 2 puffs into the lungs every 6 (six) hours as needed for wheezing or shortness of breath.    [provider]  benzonatate (TESSALON PERLES) 100 MG capsule Take 2 capsules (200 mg total) by mouth 3 (three) times daily as needed for cough. 11/16/16 11/16/17  Joni Reining, PA-C  fexofenadine-pseudoephedrine (ALLEGRA-D) 60-120 MG 12 hr tablet Take 1 tablet by mouth 2 (two) times daily. 11/16/16   Joni Reining, PA-C  ibuprofen (ADVIL,MOTRIN) 600 MG tablet Take 1 tablet (600 mg total) by mouth every 6 (six) hours as needed. 06/08/15   Hagler, Jami L, PA-C  ibuprofen (ADVIL,MOTRIN) 800 MG tablet Take 1 tablet (800 mg total) by mouth every 8 (eight) hours as needed. 06/14/15   Joni Reining, PA-C   ketorolac (ACULAR) 0.5 % ophthalmic solution Place 1 drop into the left eye 4 (four) times daily. 06/08/17   Cuthriell, Delorise Royals, PA-C  meclizine (ANTIVERT) 25 MG tablet Take 1 tablet (25 mg total) by mouth 3 (three) times daily as needed for dizziness. 06/02/15   Sharyn Creamer, MD  ondansetron (ZOFRAN ODT) 4 MG disintegrating tablet Take 1 tablet (4 mg total) by mouth every 6 (six) hours as needed for nausea or vomiting. 06/02/15   Sharyn Creamer, MD  trimethoprim-polymyxin b (POLYTRIM) ophthalmic solution Place 2 drops into the left eye every 6 (six) hours. 06/08/17   Cuthriell, Delorise Royals, PA-C    Allergies Patient has no known allergies.  No family history on file.  Social History Social History   Tobacco Use  . Smoking status: Current Every Day Smoker    Types: Cigarettes  . Smokeless tobacco: Never Used  Substance Use Topics  . Alcohol use: No  . Drug use: No    Review of Systems Constitutional: No fever/chills Eyes: No visual changes. ENT: Positive for sore throat, voice change. Cardiovascular: Denies chest pain. Respiratory: Denies shortness of breath. Gastrointestinal: No abdominal pain.  Positive for difficulty swallowing Genitourinary: Negative for dysuria. Musculoskeletal: Negative for back pain. Skin: Negative for rash. Neurological: Negative for headaches, focal weakness or numbness.  ____________________________________________   PHYSICAL EXAM:  VITAL SIGNS: ED Triage Vitals  Enc Vitals Group     BP 10/11/17 1308 (!) 157/105     Pulse  Rate 10/11/17 1308 (!) 120     Resp --      Temp 10/11/17 1308 98.9 F (37.2 C)     Temp Source 10/11/17 1308 Oral     SpO2 10/11/17 1308 98 %     Weight 10/11/17 1309 190 lb (86.2 kg)     Height 10/11/17 1309 5\' 7"  (1.702 m)     Head Circumference --      Peak Flow --      Pain Score 10/11/17 1309 10   Constitutional: Alert and oriented.  Eyes: Conjunctivae are normal.  ENT      Head: Normocephalic and atraumatic.       Nose: No congestion/rhinnorhea.      Mouth/Throat: Mucous membranes are moist.      Neck: No stridor. Hematological/Lymphatic/Immunilogical: No cervical lymphadenopathy. Cardiovascular: Tachycardic, regular rhythm.  No murmurs, rubs, or gallops.  Respiratory: Normal respiratory effort without tachypnea nor retractions. Breath sounds are clear and equal bilaterally. No wheezes/rales/rhonchi. Gastrointestinal: Soft and non tender. No rebound. No guarding.  Genitourinary: Deferred Musculoskeletal: Normal range of motion in all extremities. No lower extremity edema. Neurologic:  Normal speech and language. No gross focal neurologic deficits are appreciated.  Skin:  Skin is warm, dry and intact. No rash noted. Psychiatric: Anxious  ____________________________________________    LABS (pertinent positives/negatives)  Group a strep not detected CBC wbc 11.8, hgb 16.3, plt 264 BMP wnl except k 3.4  ____________________________________________   EKG  I, Phineas Semen, attending physician, personally viewed and interpreted this EKG  EKG Time: 1317 Rate: 109 Rhythm: sinus tachycardia Axis: normal Intervals: qtc 439 QRS: narrow ST changes: no st elevation Impression: sinus tachycardia otherwise normal ekg   ____________________________________________    RADIOLOGY  CXR No acute disease  CT neck No acute disease  ____________________________________________   PROCEDURES  Procedures  ____________________________________________   INITIAL IMPRESSION / ASSESSMENT AND PLAN / ED COURSE  Pertinent labs & imaging results that were available during my care of the patient were reviewed by me and considered in my medical decision making (see chart for details).   Patient presented to the emergency department today because of concerns for sore throat difficulty swallowing and hoarse voice.  Differential would include mass, infection.  Work-up without any concerning  findings.  At this point I think patient would benefit most from ENT follow-up.  Will give patient to follow-up information.  Discussed results and plan with patient.   ____________________________________________   FINAL CLINICAL IMPRESSION(S) / ED DIAGNOSES  Final diagnoses:  Sore throat     Note: This dictation was prepared with Dragon dictation. Any transcriptional errors that result from this process are unintentional     Phineas Semen, MD 10/11/17 1747

## 2018-01-31 ENCOUNTER — Emergency Department
Admission: EM | Admit: 2018-01-31 | Discharge: 2018-01-31 | Disposition: A | Discharge status: Home / Self Care | Payer: Self-pay | Attending: Emergency Medicine | Admitting: Emergency Medicine

## 2018-01-31 ENCOUNTER — Encounter: Payer: Self-pay | Admitting: Emergency Medicine

## 2018-01-31 DIAGNOSIS — F319 Bipolar disorder, unspecified: Secondary | ICD-10-CM | POA: Insufficient documentation

## 2018-01-31 DIAGNOSIS — J020 Streptococcal pharyngitis: Secondary | ICD-10-CM

## 2018-01-31 DIAGNOSIS — F17210 Nicotine dependence, cigarettes, uncomplicated: Secondary | ICD-10-CM | POA: Insufficient documentation

## 2018-01-31 DIAGNOSIS — J4521 Mild intermittent asthma with (acute) exacerbation: Secondary | ICD-10-CM

## 2018-01-31 LAB — GROUP A STREP BY PCR: Group A Strep by PCR: DETECTED — AB

## 2018-01-31 MED ORDER — PREDNISONE 10 MG PO TABS: ORAL_TABLET | ORAL | 0 refills | Status: DC

## 2018-01-31 MED ORDER — AMOXICILLIN 500 MG PO CAPS: 500.0000 mg | ORAL_CAPSULE | Freq: Three times a day (TID) | ORAL | 0 refills | Status: DC

## 2018-01-31 MED ORDER — IPRATROPIUM-ALBUTEROL 0.5-2.5 (3) MG/3ML IN SOLN
3.0000 mL | Freq: Once | RESPIRATORY_TRACT | Status: AC
  Administered 2018-01-31: 3 mL via RESPIRATORY_TRACT
  Filled 2018-01-31: qty 3

## 2018-01-31 NOTE — ED Notes (Signed)
See triage note  Presents with sore throat and cough  No cough noted at present   Afebrile on arrival

## 2018-01-31 NOTE — ED Triage Notes (Signed)
Pt reports hx of asthma and over the past week has had a sore throat and cough, denies fevers.

## 2018-01-31 NOTE — ED Provider Notes (Signed)
[facility] Emergency Department Provider Note  ____________________________________________   First MD Initiated Contact with Patient 01/31/18 1231     (approximate)  I have reviewed the triage vital signs and the nursing notes.   HISTORY  Chief Complaint Sore Throat and Cough  HPI Briana Bradley is a 27 y.o. female Briana Bradley to the ED with complaint of cough, congestion and sore throat.  Patient also has history of asthma and states that her albuterol inhaler is not working.  Patient denies any fever or chills.  There is been no nausea, vomiting or diarrhea.  Her pain as 7 out of 10.   Past Medical History:  Diagnosis Date  . Asthma   . Bipolar 1 disorder (HCC)     There are no active problems to display for this patient.   History reviewed. No pertinent surgical history.  Prior to Admission medications   Medication Sig Start Date End Date Taking? Authorizing Provider  albuterol (PROVENTIL HFA;VENTOLIN HFA) 108 (90 BASE) MCG/ACT inhaler Inhale 2 puffs into the lungs every 6 (six) hours as needed for wheezing or shortness of breath.    [provider]  amoxicillin (AMOXIL) 500 MG capsule Take 1 capsule (500 mg total) by mouth 3 (three) times daily. 01/31/18   Tommi RumpsSummers, Rhonda L, PA-C  meclizine (ANTIVERT) 25 MG tablet Take 1 tablet (25 mg total) by mouth 3 (three) times daily as needed for dizziness. 06/02/15   Sharyn CreamerQuale, Mark, MD  ondansetron (ZOFRAN ODT) 4 MG disintegrating tablet Take 1 tablet (4 mg total) by mouth every 6 (six) hours as needed for nausea or vomiting. 06/02/15   Sharyn CreamerQuale, Mark, MD  predniSONE (DELTASONE) 10 MG tablet Take 3 tablets once a day for 3 days 01/31/18   Tommi RumpsSummers, Rhonda L, PA-C    Allergies Patient has no known allergies.  History reviewed. No pertinent family history.  Social History Social History   Tobacco Use  . Smoking status: Current Every Day Smoker    Types: Cigarettes  . Smokeless tobacco: Never Used    Substance Use Topics  . Alcohol use: No  . Drug use: No    Review of Systems Constitutional: No fever/chills Eyes: No visual changes. ENT: Positive sore throat.  Positive left ear pain. Cardiovascular: Denies chest pain. Respiratory: Denies shortness of breath. Gastrointestinal: No abdominal pain.  No nausea, no vomiting.  No diarrhea.  Musculoskeletal: Negative for muscle aches. Skin: Negative for rash. Neurological: Negative for headaches, focal weakness or numbness. ___________________________________________   PHYSICAL EXAM:  VITAL SIGNS: ED Triage Vitals  Enc Vitals Group     BP 01/31/18 1155 (!) 134/92     Pulse Rate 01/31/18 1155 100     Resp 01/31/18 1155 16     Temp 01/31/18 1155 98.3 F (36.8 C)     Temp Source 01/31/18 1155 Oral     SpO2 01/31/18 1155 93 %     Weight 01/31/18 1149 200 lb (90.7 kg)     Height 01/31/18 1149 5\' 7"  (1.702 m)     Head Circumference --      Peak Flow --      Pain Score 01/31/18 1149 7     Pain Loc --      Pain Edu? --      Excl. in GC? --     Constitutional: Alert and oriented. Well appearing and in no acute distress. Eyes: Conjunctivae are normal. PERRL. EOMI. Head: Atraumatic. Nose: Mild congestion/rhinnorhea.  EACs are clear.  TMs are dull bilaterally. Mouth/Throat: Mucous membranes are moist.  Oropharynx erythematous without exudate.  Uvula is midline. Neck: No stridor.   Hematological/Lymphatic/Immunilogical: Mild bilateral cervical lymphadenopathy. Cardiovascular: Normal rate, regular rhythm. Grossly normal heart sounds.  Good peripheral circulation. Respiratory: Normal respiratory effort.  No retractions. Lungs faint expiratory wheezes are noted throughout.  Patient is able to speak in complete sentences without any difficulty.  No accessory muscles were noted. Musculoskeletal: No lower extremity tenderness nor edema.  No joint effusions. Neurologic:  Normal speech and language. No gross focal neurologic deficits are  appreciated.  Skin:  Skin is warm, dry and intact. No rash noted. Psychiatric: Mood and affect are normal. Speech and behavior are normal.  ____________________________________________   LABS (all labs ordered are listed, but only abnormal results are displayed)  Labs Reviewed  GROUP A STREP BY PCR - Abnormal; Notable for the following components:      Result Value   Group A Strep by PCR DETECTED (*)    All other components within normal limits    PROCEDURES  Procedure(s) performed: None  Procedures  Critical Care performed: No  ____________________________________________   INITIAL IMPRESSION / ASSESSMENT AND PLAN / ED COURSE  As part of my medical decision making, I reviewed the following data within the electronic MEDICAL RECORD NUMBER Notes from prior ED visits and [place] Controlled Substance Database  Patient presents to the ED with complaint of sore throat along with symptoms of her asthma.  Patient states that her albuterol inhaler is not working at this time.  Patient denies any fever or chills.  On exam posterior pharynx is moderately erythematous without exudate and suspicious for strep throat.  Strep test was positive.  Patient was made aware.  Patient was started on amoxicillin 500 mg 3 times daily for 10 days.  She was also given a short course of prednisone for the next 3 days.  She is encouraged to continue using her inhaler as needed for her asthma.  ____________________________________________   FINAL CLINICAL IMPRESSION(S) / ED DIAGNOSES  Final diagnoses:  Strep pharyngitis  Mild intermittent asthma with exacerbation     ED Discharge Orders         Ordered    predniSONE (DELTASONE) 10 MG tablet     01/31/18 1333    amoxicillin (AMOXIL) 500 MG capsule  3 times daily     01/31/18 1333           Note:  This document was prepared using Dragon voice recognition software and may include unintentional dictation errors.    Tommi RumpsSummers, Rhonda L, PA-C 01/31/18  1341    Sharyn CreamerQuale, Mark, MD 01/31/18 1534

## 2018-01-31 NOTE — Discharge Instructions (Signed)
Follow-up with your primary care provider or can no clinic acute care if any continued problems.  Begin taking amoxicillin 500 mg 3 times daily for 10 days.  Do not stop taking the antibiotic until you are completely finished.  You may take Tylenol as needed for throat pain or body aches.  Continue your albuterol inhaler as needed for your asthma.  Also prescription for prednisone for the next 3 days was also written and sent to your pharmacy.  You are contagious for the next 24 hours with your strep throat.

## 2018-02-01 ENCOUNTER — Emergency Department: Payer: Self-pay

## 2018-02-01 ENCOUNTER — Emergency Department
Admission: EM | Admit: 2018-02-01 | Discharge: 2018-02-02 | Disposition: A | Discharge status: Home / Self Care | Payer: Self-pay | Attending: Emergency Medicine | Admitting: Emergency Medicine

## 2018-02-01 ENCOUNTER — Other Ambulatory Visit: Payer: Self-pay

## 2018-02-01 DIAGNOSIS — S0011XA Contusion of right eyelid and periocular area, initial encounter: Secondary | ICD-10-CM | POA: Insufficient documentation

## 2018-02-01 DIAGNOSIS — R6884 Jaw pain: Secondary | ICD-10-CM | POA: Insufficient documentation

## 2018-02-01 DIAGNOSIS — Y9389 Activity, other specified: Secondary | ICD-10-CM | POA: Insufficient documentation

## 2018-02-01 DIAGNOSIS — G44311 Acute post-traumatic headache, intractable: Secondary | ICD-10-CM | POA: Insufficient documentation

## 2018-02-01 DIAGNOSIS — S0511XA Contusion of eyeball and orbital tissues, right eye, initial encounter: Secondary | ICD-10-CM

## 2018-02-01 DIAGNOSIS — S0990XA Unspecified injury of head, initial encounter: Secondary | ICD-10-CM

## 2018-02-01 DIAGNOSIS — Y92524 Gas station as the place of occurrence of the external cause: Secondary | ICD-10-CM | POA: Insufficient documentation

## 2018-02-01 DIAGNOSIS — F259 Schizoaffective disorder, unspecified: Secondary | ICD-10-CM | POA: Insufficient documentation

## 2018-02-01 DIAGNOSIS — Y998 Other external cause status: Secondary | ICD-10-CM | POA: Insufficient documentation

## 2018-02-01 DIAGNOSIS — J45909 Unspecified asthma, uncomplicated: Secondary | ICD-10-CM | POA: Insufficient documentation

## 2018-02-01 DIAGNOSIS — F17210 Nicotine dependence, cigarettes, uncomplicated: Secondary | ICD-10-CM | POA: Insufficient documentation

## 2018-02-01 HISTORY — DX: Schizoaffective disorder, bipolar type: F25.0

## 2018-02-01 HISTORY — DX: Schizoaffective disorder, unspecified: F25.9

## 2018-02-01 LAB — CBC WITH DIFFERENTIAL/PLATELET
ABS IMMATURE GRANULOCYTES: 0.04 10*3/uL (ref 0.00–0.07)
BASOS PCT: 0 %
Basophils Absolute: 0 10*3/uL (ref 0.0–0.1)
Eosinophils Absolute: 0.4 10*3/uL (ref 0.0–0.5)
Eosinophils Relative: 3 %
HCT: 45.4 % (ref 36.0–46.0)
Hemoglobin: 15.4 g/dL — ABNORMAL HIGH (ref 12.0–15.0)
IMMATURE GRANULOCYTES: 0 %
Lymphocytes Relative: 21 %
Lymphs Abs: 2.7 10*3/uL (ref 0.7–4.0)
MCH: 33.8 pg (ref 26.0–34.0)
MCHC: 33.9 g/dL (ref 30.0–36.0)
MCV: 99.6 fL (ref 80.0–100.0)
Monocytes Absolute: 0.6 10*3/uL (ref 0.1–1.0)
Monocytes Relative: 4 %
NEUTROS ABS: 9.5 10*3/uL — AB (ref 1.7–7.7)
NEUTROS PCT: 72 %
NRBC: 0 % (ref 0.0–0.2)
PLATELETS: 293 10*3/uL (ref 150–400)
RBC: 4.56 MIL/uL (ref 3.87–5.11)
RDW: 12 % (ref 11.5–15.5)
WBC: 13.2 10*3/uL — AB (ref 4.0–10.5)

## 2018-02-01 LAB — POCT PREGNANCY, URINE: Preg Test, Ur: NEGATIVE

## 2018-02-01 LAB — ETHANOL: ALCOHOL ETHYL (B): 232 mg/dL — AB (ref <10)

## 2018-02-01 LAB — URINALYSIS, COMPLETE (UACMP) WITH MICROSCOPIC
Bacteria, UA: NONE SEEN
Bilirubin Urine: NEGATIVE
GLUCOSE, UA: NEGATIVE mg/dL
HGB URINE DIPSTICK: NEGATIVE
KETONES UR: NEGATIVE mg/dL
LEUKOCYTES UA: NEGATIVE
NITRITE: NEGATIVE
PH: 6 (ref 5.0–8.0)
PROTEIN: NEGATIVE mg/dL
Specific Gravity, Urine: 1.001 — ABNORMAL LOW (ref 1.005–1.030)

## 2018-02-01 LAB — COMPREHENSIVE METABOLIC PANEL
ALT: 18 U/L (ref 0–44)
AST: 19 U/L (ref 15–41)
Albumin: 3.9 g/dL (ref 3.5–5.0)
Alkaline Phosphatase: 70 U/L (ref 38–126)
Anion gap: 10 (ref 5–15)
BUN: 9 mg/dL (ref 6–20)
CHLORIDE: 108 mmol/L (ref 98–111)
CO2: 23 mmol/L (ref 22–32)
Calcium: 8.8 mg/dL — ABNORMAL LOW (ref 8.9–10.3)
Creatinine, Ser: 0.86 mg/dL (ref 0.44–1.00)
Glucose, Bld: 94 mg/dL (ref 70–99)
POTASSIUM: 3.3 mmol/L — AB (ref 3.5–5.1)
Sodium: 141 mmol/L (ref 135–145)
Total Bilirubin: 0.4 mg/dL (ref 0.3–1.2)
Total Protein: 7.6 g/dL (ref 6.5–8.1)

## 2018-02-01 LAB — PREGNANCY, URINE: PREG TEST UR: NEGATIVE

## 2018-02-01 MED ORDER — NAPROXEN 500 MG PO TABS: 500.0000 mg | ORAL_TABLET | Freq: Two times a day (BID) | ORAL | 0 refills | Status: DC

## 2018-02-01 MED ORDER — NAPROXEN 500 MG PO TABS
500.0000 mg | ORAL_TABLET | Freq: Once | ORAL | Status: AC
  Administered 2018-02-01: 500 mg via ORAL
  Filled 2018-02-01: qty 1
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
## 2018-02-01 NOTE — Discharge Instructions (Signed)
Please apply ice to sore areas 20 minutes per hour while awake.  Return to the ER for any change or symptom of concern if unable to schedule an appointment with primary care.

## 2018-04-06 ENCOUNTER — Other Ambulatory Visit: Payer: Self-pay

## 2018-04-06 ENCOUNTER — Encounter: Payer: Self-pay | Admitting: Emergency Medicine

## 2018-04-06 ENCOUNTER — Emergency Department
Admission: EM | Admit: 2018-04-06 | Discharge: 2018-04-06 | Disposition: A | Discharge status: Home / Self Care | Payer: Self-pay | Attending: Student in an Organized Health Care Education/Training Program | Admitting: Student in an Organized Health Care Education/Training Program

## 2018-04-06 DIAGNOSIS — J45909 Unspecified asthma, uncomplicated: Secondary | ICD-10-CM | POA: Insufficient documentation

## 2018-04-06 DIAGNOSIS — K0889 Other specified disorders of teeth and supporting structures: Secondary | ICD-10-CM | POA: Insufficient documentation

## 2018-04-06 DIAGNOSIS — F17210 Nicotine dependence, cigarettes, uncomplicated: Secondary | ICD-10-CM | POA: Insufficient documentation

## 2018-04-06 MED ORDER — AMOXICILLIN 500 MG PO CAPS: 500.0000 mg | ORAL_CAPSULE | Freq: Three times a day (TID) | ORAL | 0 refills | Status: DC

## 2018-04-06 MED ORDER — TRAMADOL HCL 50 MG PO TABS: 50.0000 mg | ORAL_TABLET | Freq: Four times a day (QID) | ORAL | 0 refills | Status: DC | PRN

## 2018-04-06 NOTE — Discharge Instructions (Signed)
Follow-up with the dental clinics listed on your discharge papers.  Begin taking amoxicillin 500 mg 3 times daily for 10 days.  Tramadol is if needed for severe pain.  You may also need to eat soft foods including yogurt, baked potato and soup.  OPTIONS FOR DENTAL FOLLOW UP CARE  [place] [facility] - Local Safety Net Dental Clinics [email or web address]   [facility] [phone])  [facility] [phone])  [place] [phone] ext [phone])  [facility] [phone])  [facility] [phone]) This clinic caters to the indigent population and is on a lottery system. Location: [employer] [facility], [employer], [address], [place] Clinic Hours: Wednesdays from 6pm - 9pm, patients seen by a lottery system. For dates, call or go to [email or web address] Services: Cleanings, fillings and simple extractions. Payment Options: DENTAL WORK IS FREE OF CHARGE. Bring proof of income or support. Best way to get seen: Arrive at 5:15 pm - this is a lottery, NOT first come/first serve, so arriving earlier will not increase your chances of being seen.     [facility] [phone] Select option 1 for emergencies   Location: [facility], [place], [address], [place] Clinic Hours: No walk-ins accepted - call the day before to schedule an appointment. Check in times are 9:30 am and 1:30 pm. Services: Simple extractions, temporary fillings, pulpectomy/pulp debridement, uncomplicated abscess drainage. Payment Options: PAYMENT IS DUE AT THE TIME OF SERVICE.  Fee is usually $100-200, additional surgical procedures (e.g. abscess drainage) may be extra. Cash, checks, Visa/MasterCard accepted.  Can file Medicaid if patient is covered for dental - patient should call case worker to  check. No discount for [facility] patients. Best way to get seen: MUST call the day before and get onto the schedule. Can usually be seen the next 1-2 days. No walk-ins accepted.     [facility] [phone]   Location: [facility], [address], [place] Clinic Hours: M, W, Th, F 8am or 1:30pm, Tues 9a or 1:30 - first come/first served. Services: Simple extractions, temporary fillings, uncomplicated abscess drainage.  You do not need to be an [facility] resident. Payment Options: PAYMENT IS DUE AT THE TIME OF SERVICE. Dental insurance, otherwise sliding scale - bring proof of income or support. Depending on income and treatment needed, cost is usually $50-200. Best way to get seen: Arrive early as it is first come/first served.     [facility] [phone]   Location: [address] Clinic Hours: Mon-Thu 8a-5p Services: Most basic dental services including extractions and fillings. Payment Options: PAYMENT IS DUE AT THE TIME OF SERVICE. Sliding scale, up to 50% off - bring proof if income or support. Medicaid with dental option accepted. Best way to get seen: Call to schedule an appointment, can usually be seen within 2 weeks OR they will try to see walk-ins - show up at 8a or 2p (you may have to wait).     [facility] [phone] [place] RESIDENTS ONLY   Location: [facility], [address] [place], [place] [address] Clinic Hours: By appointment only. Monday - Thursday 8am-5pm, Friday 8am-12pm Services: Cleanings, fillings, extractions. Payment Options: PAYMENT IS DUE AT THE TIME OF SERVICE. Cash, Visa or MasterCard. Sliding scale - $30 minimum per service. Best way to get seen: Come in to office, complete packet  and make an appointment - need proof of income or support monies for each household member and proof of [facility]  residence. Usually takes about a month to get in.     [facility] [phone]   Location: [address] [facility] Clinic Hours: Walk-in Urgent Care Dental Services are offered Monday-Friday mornings only. The numbers of emergencies accepted daily is limited to the number of providers available. Maximum 15 - Mondays, Wednesdays & Thursdays Maximum 10 - Tuesdays & Fridays Services: You do not need to be a [facility] resident to be seen for a dental emergency. Emergencies are defined as pain, swelling, abnormal bleeding, or dental trauma. Walkins will receive x-rays if needed. NOTE: Dental cleaning is not an emergency. Payment Options: PAYMENT IS DUE AT THE TIME OF SERVICE. Minimum co-pay is $40.00 for uninsured patients. Minimum co-pay is $3.00 for Medicaid with dental coverage. Dental Insurance is accepted and must be presented at time of visit. Medicare does not cover dental. Forms of payment: Cash, credit card, checks. Best way to get seen: If not previously registered with the clinic, walk-in dental registration begins at 7:15 am and is on a first come/first serve basis. If previously registered with the clinic, call to make an appointment.     [facility] [phone] [place] RESIDENTS ONLY   Location: [address] [place], [place] Clinic Hours: Mon-Thu 10a-2p Services: Extractions only! Payment Options: FREE (donations accepted) - bring proof of income or support Best way to get seen: Call and schedule an appointment OR come at 8am on the 1st Monday of every month (except for holidays) when it is first come/first served.     [facility] [phone]   Location: [address] [place], [place] Clinic Hours: Friday mornings Services, Payment Options, Best way to get seen: Call for info

## 2018-04-06 NOTE — ED Provider Notes (Signed)
[facility] Emergency Department Provider Note   ____________________________________________   First MD Initiated Contact with Patient 04/06/18 1225     (approximate)  I have reviewed the triage vital signs and the nursing notes.   HISTORY  Chief Complaint Dental Pain   HPI Briana Bradley is a 28 y.o. female presents to the ED with complaint of bilateral low gum pain.  Patient denies any recent injury.  She has been eating soft foods  due to her gum pain.  She denies any fever, chills, nausea or vomiting.  Currently she does not have a dentist.  She states that even smoking increases her dental pain.  She rates her pain as 6/10.   Past Medical History:  Diagnosis Date  . Asthma   . Bipolar 1 disorder (HCC)   . Schizo affective schizophrenia (HCC)     There are no active problems to display for this patient.   History reviewed. No pertinent surgical history.  Prior to Admission medications   Medication Sig Start Date End Date Taking? Authorizing Provider  albuterol (PROVENTIL HFA;VENTOLIN HFA) 108 (90 BASE) MCG/ACT inhaler Inhale 2 puffs into the lungs every 6 (six) hours as needed for wheezing or shortness of breath.    [provider]  amoxicillin (AMOXIL) 500 MG capsule Take 1 capsule (500 mg total) by mouth 3 (three) times daily. 04/06/18   Tommi Rumps, PA-C  traMADol (ULTRAM) 50 MG tablet Take 1 tablet (50 mg total) by mouth every 6 (six) hours as needed. 04/06/18   Tommi Rumps, PA-C    Allergies Patient has no known allergies.  No family history on file.  Social History Social History   Tobacco Use  . Smoking status: Current Every Day Smoker    Types: Cigarettes  . Smokeless tobacco: Never Used  Substance Use Topics  . Alcohol use: No  . Drug use: No    Review of Systems Constitutional: No fever/chills Eyes: No visual changes. ENT: No sore throat.  Positive for dental pain. Cardiovascular: Denies chest  pain. Respiratory: Denies shortness of breath. Skin: Negative for rash. Neurological: Negative for headaches, focal weakness or numbness. ___________________________________________   PHYSICAL EXAM:  VITAL SIGNS: ED Triage Vitals  Enc Vitals Group     BP 04/06/18 1222 (!) 146/103     Pulse Rate 04/06/18 1222 89     Resp 04/06/18 1222 18     Temp 04/06/18 1222 98.3 F (36.8 C)     Temp Source 04/06/18 1222 Oral     SpO2 04/06/18 1222 99 %     Weight 04/06/18 1222 210 lb (95.3 kg)     Height 04/06/18 1222 5\' 7"  (1.702 m)     Head Circumference --      Peak Flow --      Pain Score 04/06/18 1224 6     Pain Loc --      Pain Edu? --      Excl. in GC? --    Constitutional: Alert and oriented. Well appearing and in no acute distress. Eyes: Conjunctivae are normal.  Head: Atraumatic. Nose: No congestion/rhinnorhea. Mouth/Throat: Mucous membranes are moist.  Oropharynx non-erythematous.  There is tenderness on palpation of the gums lower bilateral areas.  No active drainage is noted.  No soft tissue edema present.  There is no obvious dental abscess however there is tenderness. Neck: No stridor.   Cardiovascular: Normal rate, regular rhythm. Grossly normal heart sounds.  Good peripheral circulation. Respiratory: Normal  respiratory effort.  No retractions. Lungs CTAB. Neurologic:  Normal speech and language. No gross focal neurologic deficits are appreciated. No gait instability. Skin:  Skin is warm, dry and intact. No rash noted. Psychiatric: Mood and affect are normal. Speech and behavior are normal.  ____________________________________________   LABS (all labs ordered are listed, but only abnormal results are displayed)  Labs Reviewed - No data to display   PROCEDURES  Procedure(s) performed (including Critical Care):  Procedures   ____________________________________________   INITIAL IMPRESSION / ASSESSMENT AND PLAN / ED COURSE  As part of my medical decision  making, I reviewed the following data within the electronic MEDICAL RECORD NUMBER Notes from prior ED visits and [place] Controlled Substance Database  Patient presents to the ED with complaint of bilateral gum pain for the last 2 to 3 days.  Patient denies any fever.  On exam it was consistent with some probable gingivitis but no direct dental abscess was seen.  Patient was started on amoxicillin 500 mg 3 times daily for 10 days along with tramadol 1 every 6 hours for the next 2 days.  Patient was given a list of dental clinics listed in the area and also information about the walk-in clinic at [facility].  ____________________________________________   FINAL CLINICAL IMPRESSION(S) / ED DIAGNOSES  Final diagnoses:  Pain, dental     ED Discharge Orders         Ordered    amoxicillin (AMOXIL) 500 MG capsule  3 times daily     04/06/18 1250    traMADol (ULTRAM) 50 MG tablet  Every 6 hours PRN     04/06/18 1250           Note:  This document was prepared using Dragon voice recognition software and may include unintentional dictation errors.    Tommi Rumps, PA-C 04/06/18 1349    Willy Eddy, MD 04/06/18 [phone]

## 2018-04-06 NOTE — ED Triage Notes (Signed)
Presents with bilateral gum line pain  States pain started about 2-3 days ago.no fever

## 2018-04-19 ENCOUNTER — Emergency Department: Payer: Self-pay

## 2018-04-19 ENCOUNTER — Emergency Department
Admission: EM | Admit: 2018-04-19 | Discharge: 2018-04-19 | Disposition: A | Discharge status: Home / Self Care | Payer: Self-pay | Attending: Emergency Medicine | Admitting: Emergency Medicine

## 2018-04-19 ENCOUNTER — Encounter: Payer: Self-pay | Admitting: Emergency Medicine

## 2018-04-19 ENCOUNTER — Other Ambulatory Visit: Payer: Self-pay

## 2018-04-19 DIAGNOSIS — F17210 Nicotine dependence, cigarettes, uncomplicated: Secondary | ICD-10-CM | POA: Insufficient documentation

## 2018-04-19 DIAGNOSIS — W2209XA Striking against other stationary object, initial encounter: Secondary | ICD-10-CM | POA: Insufficient documentation

## 2018-04-19 DIAGNOSIS — S60211A Contusion of right wrist, initial encounter: Secondary | ICD-10-CM | POA: Insufficient documentation

## 2018-04-19 DIAGNOSIS — Y939 Activity, unspecified: Secondary | ICD-10-CM | POA: Insufficient documentation

## 2018-04-19 DIAGNOSIS — Y929 Unspecified place or not applicable: Secondary | ICD-10-CM | POA: Insufficient documentation

## 2018-04-19 DIAGNOSIS — Y999 Unspecified external cause status: Secondary | ICD-10-CM | POA: Insufficient documentation

## 2018-04-19 DIAGNOSIS — J45909 Unspecified asthma, uncomplicated: Secondary | ICD-10-CM | POA: Insufficient documentation

## 2018-04-19 MED ORDER — HYDROCODONE-ACETAMINOPHEN 5-325 MG PO TABS: 1.0000 | ORAL_TABLET | Freq: Four times a day (QID) | ORAL | 0 refills | Status: DC | PRN

## 2018-04-19 NOTE — ED Notes (Signed)
See triage note  Presents with pain to right arm  States hit her arm on wall about 2 days ago  Pain is to lateral wrist with min swelling   Good pulses

## 2018-04-19 NOTE — Discharge Instructions (Addendum)
Follow-up with your primary care provider or Dr. Hyacinth Meeker who is on-call for orthopedics. Wear splint for protection and support.  You may continue taking ibuprofen as needed for inflammation or Tylenol.  At bedtime elevate your hand on a pillow.  A prescription for Norco was sent to the pharmacy if you need this to help you rest at night.

## 2018-04-19 NOTE — ED Notes (Signed)
Velcro splint to R wrist.

## 2018-04-19 NOTE — ED Provider Notes (Signed)
[facility] Emergency Department Provider Note  ____________________________________________   First MD Initiated Contact with Patient 04/19/18 1053     (approximate)  I have reviewed the triage vital signs and the nursing notes.   HISTORY  Chief Complaint Wrist Pain   HPI Briana Bradley is a 28 y.o. female presents with right wrist and arm pain.  Patient states that she hit her arm on a wall 2 days ago while playing.  Patient continues to have some tenderness and "tingling" especially on the ulnar aspect of her right wrist.  Pain is increased with range of motion.  She rates her pain as 7 out of 10.       Past Medical History:  Diagnosis Date  . Asthma   . Bipolar 1 disorder (HCC)   . Schizo affective schizophrenia (HCC)     There are no active problems to display for this patient.   History reviewed. No pertinent surgical history.  Prior to Admission medications   Medication Sig Start Date End Date Taking? Authorizing Provider  albuterol (PROVENTIL HFA;VENTOLIN HFA) 108 (90 BASE) MCG/ACT inhaler Inhale 2 puffs into the lungs every 6 (six) hours as needed for wheezing or shortness of breath.    [provider]  HYDROcodone-acetaminophen (NORCO/VICODIN) 5-325 MG tablet Take 1 tablet by mouth every 6 (six) hours as needed for moderate pain. 04/19/18   Tommi Rumps, PA-C    Allergies Patient has no known allergies.  No family history on file.  Social History Social History   Tobacco Use  . Smoking status: Current Every Day Smoker    Types: Cigarettes  . Smokeless tobacco: Never Used  Substance Use Topics  . Alcohol use: No  . Drug use: No    Review of Systems Constitutional: No fever/chills Cardiovascular: Denies chest pain. Respiratory: Denies shortness of breath. Gastrointestinal: No abdominal pain.  No nausea, no vomiting.  Musculoskeletal: Positive for right wrist and forearm pain. Skin: Positive for ecchymosis and  edema. Neurological: Negative for headaches, focal weakness or numbness. ____________________________________________   PHYSICAL EXAM:  VITAL SIGNS: ED Triage Vitals  Enc Vitals Group     BP 04/19/18 1007 (!) 165/107     Pulse Rate 04/19/18 1007 83     Resp 04/19/18 1007 16     Temp 04/19/18 1007 98.4 F (36.9 C)     Temp Source 04/19/18 1007 Oral     SpO2 04/19/18 1007 98 %     Weight 04/19/18 1008 220 lb (99.8 kg)     Height 04/19/18 1008 5\' 7"  (1.702 m)     Head Circumference --      Peak Flow --      Pain Score 04/19/18 1008 7     Pain Loc --      Pain Edu? --      Excl. in GC? --    Constitutional: Alert and oriented. Well appearing and in no acute distress. Eyes: Conjunctivae are normal.  Head: Atraumatic. Nose: No congestion/rhinnorhea. Neck: No stridor.   Cardiovascular: Normal rate, regular rhythm. Grossly normal heart sounds.  Good peripheral circulation. Respiratory: Normal respiratory effort.  No retractions. Lungs CTAB. Gastrointestinal: Soft and nontender. No distention.  Musculoskeletal: On examination of the right wrist there is moderate soft tissue edema and ecchymosis is noted on the volar surface.  There is moderate tenderness on palpation of the ulnar aspect of the volar surface.  Patient is able to flex and extend all digits.  Motor or  sensory function in digits is intact as was capillary refill less than 3 seconds.  Skin is intact but ecchymotic.  Patient is able to minimally flex and extend her wrist which increases her pain. Neurologic:  Normal speech and language. No gross focal neurologic deficits are appreciated. No gait instability. Skin:  Skin is warm, dry and intact. No rash noted. Psychiatric: Mood and affect are normal. Speech and behavior are normal.  ____________________________________________   LABS (all labs ordered are listed, but only abnormal results are displayed)  Labs Reviewed - No data to display  RADIOLOGY  Official  radiology report(s): Dg Wrist Complete Right  Result Date: 04/19/2018 CLINICAL DATA:  Right wrist pain since hitting wall 2 days ago. Initial encounter. EXAM: RIGHT WRIST - COMPLETE 3+ VIEW COMPARISON:  None. FINDINGS: There is no evidence of fracture or dislocation. There is no evidence of arthropathy or other focal bone abnormality. The smell filtration of subcutaneous fat along the ulnar aspect of the distal forearm. IMPRESSION: Negative for acute bony or joint abnormality. Infiltration of subcutaneous fat along the ulnar aspect of the wrist is nonspecific could be due to contusion. Electronically Signed   By: Drusilla Kanner M.D.   On: 04/19/2018 11:19    ____________________________________________   PROCEDURES  Procedure(s) performed (including Critical Care):  Procedures Cock-up wrist splint prefabricated was applied by Selena Batten RN.  ____________________________________________   INITIAL IMPRESSION / ASSESSMENT AND PLAN / ED COURSE  As part of my medical decision making, I reviewed the following data within the electronic MEDICAL RECORD NUMBER Notes from prior ED visits and [place] Controlled Substance Database     Patient presents to the ED with complaint of right wrist pain after hitting her arm on a wall 2 days ago.  She continues to have pain with range of motion and also a tingling sensation.  She has been taking Tylenol and ibuprofen with minimal relief.  She states mostly it keeps her up at night.  Exam is questionable for a fracture however x-ray is negative.  Patient was given a prescription for Norco as needed for pain.  She is encouraged to continue taking ibuprofen as needed.  We also discussed ice and elevation to reduce some of the swelling.  She is to follow-up with an orthopedist if she continues to have problems in 1 to 2 weeks.  ____________________________________________   FINAL CLINICAL IMPRESSION(S) / ED DIAGNOSES  Final diagnoses:  Contusion of right wrist, initial  encounter     ED Discharge Orders         Ordered    HYDROcodone-acetaminophen (NORCO/VICODIN) 5-325 MG tablet  Every 6 hours PRN     04/19/18 1156           Note:  This document was prepared using Dragon voice recognition software and may include unintentional dictation errors.    Tommi Rumps, PA-C 04/19/18 1601    Emily Filbert, MD 04/19/18 [phone]

## 2018-04-19 NOTE — ED Triage Notes (Signed)
Patient states she hit her right arm on a wall 2 days ago, here because pain is not improving.  Positive radial pulse, states she can feel touch but hand is "tingly".  No obvious deformity.
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
## 2020-11-05 ENCOUNTER — Emergency Department
Admission: EM | Admit: 2020-11-05 | Discharge: 2020-11-05 | Disposition: A | Discharge status: Home / Self Care | Payer: Self-pay | Attending: Emergency Medicine | Admitting: Emergency Medicine

## 2020-11-05 ENCOUNTER — Other Ambulatory Visit: Payer: Self-pay

## 2020-11-05 DIAGNOSIS — F17210 Nicotine dependence, cigarettes, uncomplicated: Secondary | ICD-10-CM | POA: Insufficient documentation

## 2020-11-05 DIAGNOSIS — X58XXXA Exposure to other specified factors, initial encounter: Secondary | ICD-10-CM | POA: Insufficient documentation

## 2020-11-05 DIAGNOSIS — S161XXA Strain of muscle, fascia and tendon at neck level, initial encounter: Secondary | ICD-10-CM | POA: Insufficient documentation

## 2020-11-05 DIAGNOSIS — G8929 Other chronic pain: Secondary | ICD-10-CM | POA: Insufficient documentation

## 2020-11-05 DIAGNOSIS — J45909 Unspecified asthma, uncomplicated: Secondary | ICD-10-CM | POA: Insufficient documentation

## 2020-11-05 DIAGNOSIS — S39012A Strain of muscle, fascia and tendon of lower back, initial encounter: Secondary | ICD-10-CM | POA: Insufficient documentation

## 2020-11-05 MED ORDER — CYCLOBENZAPRINE HCL 10 MG PO TABS: 10.0000 mg | ORAL_TABLET | Freq: Three times a day (TID) | ORAL | 0 refills | Status: AC | PRN

## 2020-11-05 NOTE — Discharge Instructions (Addendum)
Please use ibuprofen or naproxen scheduled for the next 5 days

## 2020-11-05 NOTE — ED Provider Notes (Signed)
[facility] Emergency Department Provider Note   ____________________________________________   Event Date/Time   First MD Initiated Contact with Patient 11/05/20 1432     (approximate)  I have reviewed the triage vital signs and the nursing notes.   HISTORY  Chief Complaint Back Pain    HPI Briana Bradley is a 30 y.o. 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  Denies any lower extremity paresthesias, bowel/bladder incontinence, fever, or history of IV drug abuse   Per medical record review, past medical history noncontributory          Past Medical History:  Diagnosis Date   Asthma    Bipolar 1 disorder (HCC)    Schizo affective schizophrenia (HCC)     There are no problems to display for this patient.   History reviewed. No pertinent surgical history.  Prior to Admission medications   Medication Sig Start Date End Date Taking? Authorizing Provider  cyclobenzaprine (FLEXERIL) 10 MG tablet Take 1 tablet (10 mg total) by mouth 3 (three) times daily as needed for up to 4 days for muscle spasms. 11/05/20 11/09/20 Yes Harjas Biggins, Clent Jacks, MD  albuterol (PROVENTIL HFA;VENTOLIN HFA) 108 (90 BASE) MCG/ACT inhaler Inhale 2 puffs into the lungs every 6 (six) hours as needed for wheezing or shortness of breath.    [provider]  HYDROcodone-acetaminophen (NORCO/VICODIN) 5-325 MG tablet Take 1 tablet by mouth every 6 (six) hours as needed for moderate pain. 04/19/18   Tommi Rumps, PA-C    Allergies Patient has no known allergies.  No family history on file.  Social History Social History   Tobacco Use   Smoking status: Every Day    Types: Cigarettes   Smokeless tobacco: Never  Vaping Use   Vaping Use: Never used  Substance Use Topics   Alcohol use: No   Drug use: No    Review of Systems Constitutional: No fever/chills Eyes: No visual changes. ENT: No sore throat. Cardiovascular: Denies chest pain. Respiratory: Denies shortness of breath. Gastrointestinal: No abdominal pain.  No nausea, no vomiting.  No diarrhea. Genitourinary: Negative for dysuria. Musculoskeletal: Positive for acute lower right lumbar paraspinal back pain Skin: Negative for rash. Neurological: Negative for headaches, weakness/numbness/paresthesias in any extremity Psychiatric: Negative for suicidal ideation/homicidal ideation   ____________________________________________   PHYSICAL EXAM:  VITAL SIGNS: ED Triage Vitals  Enc Vitals Group     BP 11/05/20 1228 (!) 138/94     Pulse Rate 11/05/20 1228 62     Resp 11/05/20 1228 20     Temp 11/05/20 1228 98.4 F (36.9 C)     Temp Source 11/05/20 1228 Oral     SpO2 11/05/20 1228 100 %     Weight 11/05/20 1228 200 lb (90.7 kg)     Height 11/05/20 1228 5\' 7"  (1.702 m)     Head Circumference --      Peak Flow --      Pain Score 11/05/20 1157 7     Pain Loc --      Pain Edu? --  Excl. in GC? --    Constitutional: Alert and oriented. Well appearing and in no acute distress. Eyes: Conjunctivae are normal. PERRL. Head: Atraumatic. Nose: No congestion/rhinnorhea. Mouth/Throat: Mucous membranes are moist. Neck: No stridor Cardiovascular: Grossly normal heart sounds.  Good peripheral circulation. Respiratory: Normal respiratory effort.  No retractions. Gastrointestinal: Soft and nontender. No distention. Musculoskeletal: Tenderness to palpation in the right lower lumbar paraspinal musculature with associated muscle  spasm.  Negative straight leg raise bilaterally.  No obvious deformities Neurologic:  Normal speech and language. No gross focal neurologic deficits are appreciated. Skin:  Skin is warm and dry. No rash noted. Psychiatric: Mood and affect are normal. Speech and behavior are normal.  ____________________________________________   LABS (all labs ordered are listed, but only abnormal results are displayed)  Labs Reviewed  POC URINE PREG, ED   PROCEDURES  Procedure(s) performed (including Critical Care):  Procedures   ____________________________________________   INITIAL IMPRESSION / ASSESSMENT AND PLAN / ED COURSE  As part of my medical decision making, I reviewed the following data within the electronic medical record, if available:  Nursing notes reviewed and incorporated, Labs reviewed, EKG interpreted, Old chart reviewed, Radiograph reviewed and Notes from prior ED visits reviewed and incorporated      Patient presents for low back pain. Given History and Exam the patient appears to be at low risk for Spinal Cord Compression Syndrome, Vertebral Malignancy/Mets, acute Spinal Fracture, Vertebral Osteomyelitis, Epidural Abscess, Infected or Obstructing Kidney Stone.  Their presentation appears most likely to be secondary to non-emergent musculoskeletal etiology vs non-emergent disc herniation.  ED Workup: Defer imaging and labwork for outpatient follow up at this time.  Disposition: Discharge. Strict return precautions discussed with patient with full understanding. Advised patient to follow up promptly with primary care provider      ____________________________________________   FINAL CLINICAL IMPRESSION(S) / ED DIAGNOSES  Final diagnoses:  Strain of lumbar region, initial encounter  Strain of neck muscle, initial encounter     ED Discharge Orders          Ordered    cyclobenzaprine (FLEXERIL) 10 MG tablet  3 times daily PRN        11/05/20 1444              Note:  This document was prepared using Dragon voice recognition software and may include unintentional dictation errors.    Merwyn Katos, MD 11/05/20 1452

## 2020-11-05 NOTE — ED Triage Notes (Signed)
Pt come with c/o back pain.

## 2021-03-13 ENCOUNTER — Emergency Department (HOSPITAL_COMMUNITY): Payer: Self-pay

## 2021-03-13 ENCOUNTER — Emergency Department (HOSPITAL_COMMUNITY)
Admission: EM | Admit: 2021-03-13 | Discharge: 2021-03-13 | Disposition: A | Discharge status: Home / Self Care | Payer: Self-pay | Attending: Emergency Medicine | Admitting: Emergency Medicine

## 2021-03-13 ENCOUNTER — Other Ambulatory Visit: Payer: Self-pay

## 2021-03-13 ENCOUNTER — Encounter (HOSPITAL_COMMUNITY): Payer: Self-pay

## 2021-03-13 DIAGNOSIS — R102 Pelvic and perineal pain: Secondary | ICD-10-CM | POA: Insufficient documentation

## 2021-03-13 DIAGNOSIS — R300 Dysuria: Secondary | ICD-10-CM | POA: Insufficient documentation

## 2021-03-13 LAB — WET PREP, GENITAL
Sperm: NONE SEEN
Trich, Wet Prep: NONE SEEN
WBC, Wet Prep HPF POC: <10 (ref <10)
Yeast Wet Prep HPF POC: NONE SEEN

## 2021-03-13 LAB — URINALYSIS, ROUTINE W REFLEX MICROSCOPIC
Bilirubin Urine: NEGATIVE
Glucose, UA: NEGATIVE mg/dL
Hgb urine dipstick: NEGATIVE
Ketones, ur: NEGATIVE mg/dL
Leukocytes,Ua: NEGATIVE
Nitrite: NEGATIVE
Protein, ur: NEGATIVE mg/dL
Specific Gravity, Urine: 1.02 (ref 1.005–1.030)
pH: 7 (ref 5.0–8.0)

## 2021-03-13 LAB — PREGNANCY, URINE: Preg Test, Ur: NEGATIVE

## 2021-03-13 MED ORDER — PHENAZOPYRIDINE HCL 200 MG PO TABS: 200.0000 mg | ORAL_TABLET | Freq: Three times a day (TID) | ORAL | 0 refills | Status: DC | PRN

## 2021-03-13 NOTE — Discharge Instructions (Addendum)
Take the medication as directed until it is finished.  This medication will turn your urine and orange to brown color.  Your urine will return to normal color once the medication is completed.  Drink plenty of water.  Call the urologist listed to arrange a follow-up appointment.

## 2021-03-13 NOTE — ED Provider Notes (Signed)
[facility] EMERGENCY DEPARTMENT Provider Note   CSN: [id number] Arrival date & time: 03/13/21  1344     History  Chief Complaint  Patient presents with   Pelvic Pressure    Briana Bradley is a 31 y.o. female.  HPI      Briana Bradley is a 31 y.o. female who presents to the Emergency Department complaining of pelvic pressure and increased frequency of urination.  Symptoms have been waxing and waning for 1 month, but worse over the last 3 days.  She describes feeling urgency to urinate and voiding smaller amounts then feeling the need to urinate again.  She also states that something is "hanging out of my vagina."  She denies childbirth or prior abdominal or pelvic surgeries.  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  She denies any fever, chills, nausea or vomiting.  She reports pain of her lower back but states this is normal for her. Periods are irregular at baseline.  She does not use any forms of birth control  Home Medications Prior to Admission medications   Medication Sig Start Date End Date Taking? Authorizing Provider  albuterol (PROVENTIL HFA;VENTOLIN HFA) 108 (90 BASE) MCG/ACT inhaler Inhale 2 puffs into the lungs every 6 (six) hours as needed for wheezing or shortness of breath.    [provider]      Allergies    Patient has no known allergies.    Review of Systems   Review of Systems  Constitutional:  Negative for chills, fatigue and fever.  Respiratory:  Negative for shortness of breath.   Cardiovascular:  Negative for chest pain and palpitations.  Gastrointestinal:  Negative for abdominal pain, blood in stool, nausea and vomiting.  Genitourinary:  Positive for difficulty urinating, dysuria, menstrual problem and pelvic pain. Negative for flank pain and hematuria.  Musculoskeletal:  Negative for arthralgias and back pain.  Skin:  Negative for rash.  Neurological:  Negative for dizziness,  weakness and numbness.  Hematological:  Does not bruise/bleed easily.  All other systems reviewed and are negative.  Physical Exam Updated Vital Signs BP (!) 159/103 (BP Location: Right Arm)    Pulse (!) 59    Temp 98.3 F (36.8 C) (Oral)    Resp 17    Ht 5\' 7"  (1.702 m)    Wt 90.7 kg    SpO2 100%    BMI 31.32 kg/m  Physical Exam Vitals and nursing note reviewed. Exam conducted with a chaperone present.  Constitutional:      Appearance: Normal appearance. She is not ill-appearing or toxic-appearing.  Cardiovascular:     Rate and Rhythm: Normal rate and regular rhythm.     Pulses: Normal pulses.  Pulmonary:     Effort: Pulmonary effort is normal.  Abdominal:     General: There is no distension.     Palpations: Abdomen is soft.     Tenderness: There is abdominal tenderness. There is no right CVA tenderness or left CVA tenderness.     Comments: Mild tenderness to palpation over the suprapubic region.    Genitourinary:    Vagina: No bleeding.     Cervix: No cervical motion tenderness, discharge or cervical bleeding.     Uterus: Not enlarged and not tender.      Adnexa:        Right: No mass or tenderness.         Left: No mass or tenderness.  Comments: Tenderness with palpation at the urethral meatus.  No visible prolapse on exam.   Musculoskeletal:        General: Normal range of motion.  Skin:    General: Skin is warm.     Capillary Refill: Capillary refill takes less than 2 seconds.     Findings: No rash.  Neurological:     General: No focal deficit present.     Mental Status: She is alert.     Sensory: No sensory deficit.     Motor: No weakness.    ED Results / Procedures / Treatments   Labs (all labs ordered are listed, but only abnormal results are displayed) Labs Reviewed  WET PREP, GENITAL - Abnormal; Notable for the following components:      Result Value   Clue Cells Wet Prep HPF POC PRESENT (*)    All other components within normal limits  URINALYSIS,  ROUTINE W REFLEX MICROSCOPIC  PREGNANCY, URINE  GC/CHLAMYDIA PROBE AMP ([place]) NOT AT [facility]    EKG None  Radiology No results found.  Procedures Procedures    Medications Ordered in ED Medications - No data to display  ED Course/ Medical Decision Making/ A&P                           Medical Decision Making Amount and/or Complexity of Data Reviewed Labs: ordered. Radiology: ordered.  Risk Prescription drug management.   Pt here for evaluation of pelvic pressure.  Sx's present for several months, but worse x 3 days. Associated with urinary urgency and feeling that "something is dropping out of my vagina"  Denies abdominal pain, vaginal discharge or abnormal vaginal bleeding.    Ddx would include UTI, STI, bladder or uterine prolapse  On exam, pt well appearing. Hypertensive on arrival but improved after recheck.  She is well appearing, non toxic.   Labs interpreted by me show urinalysis w/o evidence for infection or hematuria, wet prep with clue cells present.  Preg test negative.  US of the pelvis w/o acute finding.  GC and chlamydia results pending.    Pt reported pelvic pressure and feeling that "something is dropping out of her vagina" is unclear.  I did not appreciate a prolapse on exam.  Doubt emergent process, sx's possibly related to bladder spasms.  I feel that she is appropriate for d/c home and she agrees to close up with urology.  I will prescribe pyridium.  Return precautions discussed.           Final Clinical Impression(s) / ED Diagnoses Final diagnoses:  Dysuria    Rx / DC Orders ED Discharge Orders     None         Bufford Lope 03/15/21 2307    Daleen Bo, MD 03/17/21 [phone]

## 2021-03-13 NOTE — ED Triage Notes (Signed)
Pt presents to ED with complaints of pelvic pressure. Pt states feels like something is hanging out down there. Pt states has been going on for about 1 month but worse over the last 3 days. Pt states also with urine urgency and frequency.

## 2021-03-14 LAB — GC/CHLAMYDIA PROBE AMP (~~LOC~~) NOT AT ARMC
Chlamydia: NEGATIVE
Comment: NEGATIVE
Comment: NORMAL
Neisseria Gonorrhea: NEGATIVE

## 2022-03-27 ENCOUNTER — Ambulatory Visit
Admission: EM | Admit: 2022-03-27 | Discharge: 2022-03-27 | Disposition: A | Discharge status: Home / Self Care | Payer: Medicaid Other | Attending: Emergency Medicine | Admitting: Emergency Medicine

## 2022-03-27 ENCOUNTER — Ambulatory Visit: Payer: Self-pay

## 2022-03-27 DIAGNOSIS — U071 COVID-19: Secondary | ICD-10-CM | POA: Diagnosis not present

## 2022-03-27 LAB — SARS CORONAVIRUS 2 (TAT 6-24 HRS): SARS Coronavirus 2: POSITIVE — AB

## 2022-03-27 NOTE — ED Provider Notes (Addendum)
[facility]    CSN: [id number] Arrival date & time: 03/27/22  1241      History   Chief Complaint Chief Complaint  Patient presents with   Covid Positive    HPI DAJON HUTCHERSON is a 32 y.o. female.  Patient tested positive for COVID at home today. She requests a PCR test.  She reports fever, chills, body aches, congestion, cough since yesterday.   She had vomiting yesterday; none today.  She has diarrhea today.  Treatment at home with ibuprofen.  No chest pain, shortness of breath, or other symptoms.  Her medical history includes asthma, schizoaffective schizophrenia, bipolar 1 disorder.  Current everyday smoker.   The history is provided by the patient and medical records.    Past Medical History:  Diagnosis Date   Asthma    Bipolar 1 disorder ([place])    Schizo affective schizophrenia ([place])     There are no problems to display for this patient.   History reviewed. No pertinent surgical history.  OB History   No obstetric history on file.      Home Medications    Prior to Admission medications   Medication Sig Start Date End Date Taking? Authorizing Provider  albuterol (PROVENTIL HFA;VENTOLIN HFA) 108 (90 BASE) MCG/ACT inhaler Inhale 2 puffs into the lungs every 6 (six) hours as needed for wheezing or shortness of breath.    [provider]  phenazopyridine (PYRIDIUM) 200 MG tablet Take 1 tablet (200 mg total) by mouth 3 (three) times daily as needed for pain. Patient not taking: Reported on 03/27/2022 03/13/21   Kem Parkinson, PA-C    Family History No family history on file.  Social History Social History   Tobacco Use   Smoking status: Every Day    Packs/day: 1.00    Types: Cigarettes   Smokeless tobacco: Never  Vaping Use   Vaping Use: Never used  Substance Use Topics   Alcohol use: No   Drug use: Yes    Types: Marijuana     Allergies   Patient has no known allergies.   Review of Systems Review of Systems  Constitutional:   Positive for chills and fever.  HENT:  Positive for congestion. Negative for ear pain and sore throat.   Respiratory:  Positive for cough. Negative for shortness of breath.   Cardiovascular:  Negative for chest pain and palpitations.  Gastrointestinal:  Positive for diarrhea and vomiting. Negative for abdominal pain.  Skin:  Negative for color change and rash.  All other systems reviewed and are negative.    Physical Exam Triage Vital Signs ED Triage Vitals  Enc Vitals Group     BP      Pulse      Resp      Temp      Temp src      SpO2      Weight      Height      Head Circumference      Peak Flow      Pain Score      Pain Loc      Pain Edu?      Excl. in [place]?    No data found.  Updated Vital Signs BP 121/80   Pulse 96   Temp 98.1 F (36.7 C)   Resp 18   LMP 02/24/2022   SpO2 96%   Visual Acuity Right Eye Distance:   Left Eye Distance:   Bilateral Distance:    Right  Eye Near:   Left Eye Near:    Bilateral Near:     Physical Exam Vitals and nursing note reviewed.  Constitutional:      General: She is not in acute distress.    Appearance: She is well-developed. She is not ill-appearing.  HENT:     Right Ear: Tympanic membrane normal.     Left Ear: Tympanic membrane normal.     Nose: Nose normal.     Mouth/Throat:     Mouth: Mucous membranes are moist.     Pharynx: Oropharynx is clear.  Cardiovascular:     Rate and Rhythm: Normal rate and regular rhythm.     Heart sounds: Normal heart sounds.  Pulmonary:     Effort: Pulmonary effort is normal. No respiratory distress.     Breath sounds: Normal breath sounds. No wheezing, rhonchi or rales.  Abdominal:     General: Bowel sounds are normal.     Palpations: Abdomen is soft.     Tenderness: There is no abdominal tenderness. There is no guarding or rebound.  Musculoskeletal:     Cervical back: Neck supple.  Skin:    General: Skin is warm and dry.  Neurological:     Mental Status: She is alert.   Psychiatric:        Mood and Affect: Mood normal.        Behavior: Behavior normal.      UC Treatments / Results  Labs (all labs ordered are listed, but only abnormal results are displayed) Labs Reviewed  SARS CORONAVIRUS 2 (TAT 6-24 HRS)    EKG   Radiology No results found.  Procedures Procedures (including critical care time)  Medications Ordered in UC Medications - No data to display  Initial Impression / Assessment and Plan / UC Course  I have reviewed the triage vital signs and the nursing notes.  Pertinent labs & imaging results that were available during my care of the patient were reviewed by me and considered in my medical decision making (see chart for details).   COVID-19.  Afebrile, VSS.  No respiratory distress, lungs are clear, O2 sat 96% on room air.  Per patient request, PCR COVID pending.  If COVID positive, patient declines antiviral treatment.  Discussed symptomatic treatment including Tylenol, rest, hydration.  Instructed patient to follow up with her PCP if symptoms are not improving.  ED precautions given.  She agrees to plan of care.   Final Clinical Impressions(s) / UC Diagnoses   Final diagnoses:  [id number]     Discharge Instructions      Your COVID test is pending.    Take Tylenol as needed for fever or discomfort.  Rest and keep yourself hydrated.    Follow-up with your primary care provider if your symptoms are not improving.         ED Prescriptions   None    PDMP not reviewed this encounter.   Sharion Balloon, NP 03/27/22 1320    Sharion Balloon, NP 03/27/22 1324

## 2022-03-27 NOTE — Discharge Instructions (Signed)
Your COVID test is pending.    Take Tylenol as needed for fever or discomfort.  Rest and keep yourself hydrated.    Follow-up with your primary care provider if your symptoms are not improving.

## 2022-03-27 NOTE — ED Triage Notes (Signed)
Patient to Urgent Care with complaints of chills/ generalized body aches/ headache/ chest congestion that started yesterday.   Fever of 101.3 approx 1 hour agp. Taking ibuprofen.  Reports taking an at home covid test this afternoon with a positive result.

## 2022-09-25 ENCOUNTER — Ambulatory Visit
Admission: RE | Admit: 2022-09-25 | Discharge: 2022-09-25 | Disposition: A | Discharge status: Home / Self Care | Payer: MEDICAID | Source: Ambulatory Visit | Attending: Emergency Medicine | Admitting: Emergency Medicine

## 2022-09-25 ENCOUNTER — Other Ambulatory Visit: Payer: Self-pay

## 2022-09-25 VITALS — BP 165/111 | HR 85 | Temp 98.0°F | Resp 20

## 2022-09-25 DIAGNOSIS — M25512 Pain in left shoulder: Secondary | ICD-10-CM | POA: Diagnosis not present

## 2022-09-25 DIAGNOSIS — G8929 Other chronic pain: Secondary | ICD-10-CM

## 2022-09-25 DIAGNOSIS — F17200 Nicotine dependence, unspecified, uncomplicated: Secondary | ICD-10-CM

## 2022-09-25 DIAGNOSIS — R030 Elevated blood-pressure reading, without diagnosis of hypertension: Secondary | ICD-10-CM | POA: Diagnosis not present

## 2022-09-25 MED ORDER — CYCLOBENZAPRINE HCL 5 MG PO TABS: 5.0000 mg | ORAL_TABLET | Freq: Three times a day (TID) | ORAL | 0 refills | Status: AC | PRN

## 2022-09-25 NOTE — ED Triage Notes (Addendum)
Left shoulder pain that started yesterday. Pt states as a teenage she was slammed onto the floor while wrestling and dislocated her shoulder and has been having pain off and on since. No new injuries since

## 2022-09-25 NOTE — ED Provider Notes (Signed)
[facility]    CSN: [id number] Arrival date & time: 09/25/22  1835      History   Chief Complaint Chief Complaint  Patient presents with   Shoulder Pain    Entered by patient   Shoulder Injury    HPI Briana Bradley is a 32 y.o. female.   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 Pt took tylenol for pain. Pt denies any new injuries.   Pt is aware she has elevated blood pressure, trying to work out insurance and provider.   Pt endorses smoking.  The history is provided by the patient. No language interpreter was used.    Past Medical History:  Diagnosis Date   Asthma    Bipolar 1 disorder (HCC)    Schizo affective schizophrenia [facility])     Patient Active Problem List   Diagnosis Date Noted   Chronic left shoulder pain 09/25/2022   Smoker 09/25/2022   Elevated blood pressure reading 09/25/2022    History reviewed. No pertinent surgical history.  OB History   No obstetric history on file.      Home Medications    Prior to Admission medications   Medication Sig Start Date End Date Taking? Authorizing Provider  albuterol (PROVENTIL HFA;VENTOLIN HFA) 108 (90 BASE) MCG/ACT inhaler Inhale 2 puffs into the lungs every 6 (six) hours as needed for wheezing or shortness of breath.   Yes [provider]  cyclobenzaprine (FLEXERIL) 5 MG tablet Take 1 tablet (5 mg total) by mouth 3 (three) times daily as needed for up to 3 days for muscle spasms. 09/25/22 09/28/22 Yes Johndaniel Catlin, Para March, NP  phenazopyridine (PYRIDIUM) 200 MG tablet Take 1 tablet (200 mg total) by mouth 3 (three) times daily as needed for pain. Patient not taking: Reported on 03/27/2022 03/13/21   Pauline Aus, PA-C    Family History History reviewed. No pertinent family history.  Social History Social History   Tobacco Use   Smoking status: Every  Day    Current packs/day: 1.00    Types: Cigarettes   Smokeless tobacco: Never  Vaping Use   Vaping status: Never Used  Substance Use Topics   Alcohol use: No   Drug use: Yes    Types: Marijuana     Allergies   Patient has no known allergies.   Review of Systems Review of Systems  Constitutional:  Negative for fever.  Musculoskeletal:  Positive for arthralgias and myalgias. Negative for joint swelling.  Skin: Negative.   All other systems reviewed and are negative.    Physical Exam Triage Vital Signs ED Triage Vitals  Encounter Vitals Group     BP      Systolic BP Percentile      Diastolic BP Percentile      Pulse      Resp      Temp      Temp src      SpO2      Weight      Height      Head Circumference      Peak Flow      Pain Score      Pain Loc      Pain Education      Exclude from Growth Chart    No data found.  Updated Vital Signs BP (!) 165/111   Pulse 85  Temp 98 F (36.7 C)   Resp 20   SpO2 98%   Visual Acuity Right Eye Distance:   Left Eye Distance:   Bilateral Distance:    Right Eye Near:   Left Eye Near:    Bilateral Near:     Physical Exam Vitals and nursing note reviewed.  Constitutional:      Appearance: She is well-developed and well-groomed. She is obese.  HENT:     Head: Normocephalic.  Cardiovascular:     Rate and Rhythm: Normal rate and regular rhythm.     Pulses: Normal pulses.          Radial pulses are 2+ on the right side and 2+ on the left side.  Pulmonary:     Effort: Pulmonary effort is normal.  Musculoskeletal:     Left shoulder: No crepitus. Normal pulse.     Comments: Strength 5/5, handgrips equal bilaterally, 2+ bilateral radial pulse, moves all extremities well x 4.  Neurological:     General: No focal deficit present.     Mental Status: She is alert and oriented to person, place, and time.     GCS: GCS eye subscore is 4. GCS verbal subscore is 5. GCS motor subscore is 6.     Cranial Nerves: No  cranial nerve deficit.     Sensory: No sensory deficit.  Psychiatric:        Attention and Perception: Attention normal.        Mood and Affect: Mood normal.        Speech: Speech normal.        Behavior: Behavior normal. Behavior is cooperative.     UC Treatments / Results  Labs (all labs ordered are listed, but only abnormal results are displayed) Labs Reviewed - No data to display  EKG   Radiology No results found.  Procedures Procedures (including critical care time)  Medications Ordered in UC Medications - No data to display  Initial Impression / Assessment and Plan / UC Course  I have reviewed the triage vital signs and the nursing notes.  Pertinent labs & imaging results that were available during my care of the patient were reviewed by me and considered in my medical decision making (see chart for details).     Ddx: Chronic left shoulder pain, myalgia, arthralgia, arthritis, radiculopathy,smoker Final Clinical Impressions(s) / UC Diagnoses   Final diagnoses:  Smoker  Chronic left shoulder pain  Elevated blood pressure reading     Discharge Instructions      Please get established with PCP for blood pressure management/recheck.  Stop smoking.  Follow-up with orthopedics for further evaluation of chronic left shoulder pain-call for appointment.  Take muscle relaxer as prescribed, do not drink or drive taking muscle relaxer.  Return as needed     ED Prescriptions     Medication Sig Dispense Auth. Provider   cyclobenzaprine (FLEXERIL) 5 MG tablet Take 1 tablet (5 mg total) by mouth 3 (three) times daily as needed for up to 3 days for muscle spasms. 9 tablet Malaisha Silliman, Para March, NP      PDMP not reviewed this encounter.   Clancy Gourd, NP 09/25/22 2015

## 2022-09-25 NOTE — Discharge Instructions (Addendum)
Please get established with PCP for blood pressure management/recheck.  Stop smoking.  Follow-up with orthopedics for further evaluation of chronic left shoulder pain-call for appointment.  Take muscle relaxer as prescribed, do not drink or drive taking muscle relaxer.  Return as needed

## 2023-02-17 ENCOUNTER — Ambulatory Visit: Payer: MEDICAID | Admitting: Nurse Practitioner

## 2023-02-24 ENCOUNTER — Encounter: Payer: Self-pay | Admitting: Family Medicine

## 2023-08-17 ENCOUNTER — Ambulatory Visit: Payer: Self-pay | Admitting: General Practice

## 2023-08-17 NOTE — Telephone Encounter (Signed)
 FYI Only or Action Required?: FYI only for provider.  Patient was last seen in primary care on N/A.  Called Nurse Triage reporting Wheezing.  Symptoms began several days ago.   Symptoms are: unchanged.  Triage Disposition: Call EMS 911 Now  Patient/caregiver understands and will follow disposition?: Yes                 Copied from CRM [id number]. Topic: Clinical - Red Word Triage >> Aug 17, 2023  2:14 PM Ivette P wrote: Red Word that prompted transfer to Nurse Triage: Tightness chest, sinus infection. Wheezing in the chest. Started a few days ago. Reason for Disposition  [1] Chest pain lasts > 5 minutes AND [2] described as crushing, pressure-like, or heavy  Answer Assessment - Initial Assessment Questions Patient refuses ambulance and states she has someone to take her to the ED now.   1. LOCATION: Where does it hurt?       Chest is tight; in the middle of chest 2. RADIATION: Does the pain go anywhere else? (e.g., into neck, jaw, arms, back)     Denies radiation 3. ONSET: When did the chest pain begin? (Minutes, hours or days)      A couple of days ago 4. PATTERN: Does the pain come and go, or has it been constant since it started?  Does it get worse with exertion?      Constant 5. DURATION: How long does it last (e.g., seconds, minutes, hours)     Constant 6. SEVERITY: How bad is the pain?  (e.g., Scale 1-10; mild, moderate, or severe)    - MILD (1-3): doesn't interfere with normal activities     - MODERATE (4-7): interferes with normal activities or awakens from sleep    - SEVERE (8-10): excruciating pain, unable to do any normal activities       7-8/10 pain level 7. CARDIAC RISK FACTORS: Do you have any history of heart problems or risk factors for heart disease? (e.g., angina, prior heart attack; diabetes, high blood pressure, high cholesterol, smoker, or strong family history of heart disease)     No 8. PULMONARY RISK FACTORS: Do you have  any history of lung disease?  (e.g., blood clots in lung, asthma, emphysema, birth control pills)     No 9. CAUSE: What do you think is causing the chest pain?     No 10. OTHER SYMPTOMS: Do you have any other symptoms? (e.g., dizziness, nausea, vomiting, sweating, fever, difficulty breathing, cough)       Difficulty breathing, cold sweats, wheezing, cough  Protocols used: Chest Pain-A-AH

## 2023-11-27 ENCOUNTER — Ambulatory Visit (HOSPITAL_COMMUNITY)
Admission: EM | Admit: 2023-11-27 | Discharge: 2023-11-28 | Disposition: A | Discharge status: Home / Self Care | Payer: MEDICAID | Attending: Psychiatry | Admitting: Psychiatry

## 2023-11-27 DIAGNOSIS — Z634 Disappearance and death of family member: Secondary | ICD-10-CM | POA: Insufficient documentation

## 2023-11-27 DIAGNOSIS — Z818 Family history of other mental and behavioral disorders: Secondary | ICD-10-CM | POA: Insufficient documentation

## 2023-11-27 DIAGNOSIS — F332 Major depressive disorder, recurrent severe without psychotic features: Secondary | ICD-10-CM | POA: Insufficient documentation

## 2023-11-27 DIAGNOSIS — F1290 Cannabis use, unspecified, uncomplicated: Secondary | ICD-10-CM | POA: Insufficient documentation

## 2023-11-27 DIAGNOSIS — R45851 Suicidal ideations: Secondary | ICD-10-CM | POA: Diagnosis not present

## 2023-11-27 DIAGNOSIS — F419 Anxiety disorder, unspecified: Secondary | ICD-10-CM | POA: Insufficient documentation

## 2023-11-27 DIAGNOSIS — R251 Tremor, unspecified: Secondary | ICD-10-CM | POA: Insufficient documentation

## 2023-11-27 DIAGNOSIS — Z91410 Personal history of adult physical and sexual abuse: Secondary | ICD-10-CM | POA: Insufficient documentation

## 2023-11-27 DIAGNOSIS — Z91411 Personal history of adult psychological abuse: Secondary | ICD-10-CM | POA: Insufficient documentation

## 2023-11-27 DIAGNOSIS — R110 Nausea: Secondary | ICD-10-CM | POA: Insufficient documentation

## 2023-11-27 DIAGNOSIS — R0989 Other specified symptoms and signs involving the circulatory and respiratory systems: Secondary | ICD-10-CM | POA: Insufficient documentation

## 2023-11-27 DIAGNOSIS — F1010 Alcohol abuse, uncomplicated: Secondary | ICD-10-CM | POA: Diagnosis not present

## 2023-11-27 LAB — COMPREHENSIVE METABOLIC PANEL WITH GFR
ALT: 23 U/L (ref 0–44)
AST: 26 U/L (ref 15–41)
Albumin: 4.1 g/dL (ref 3.5–5.0)
Alkaline Phosphatase: 67 U/L (ref 38–126)
Anion gap: 15 (ref 5–15)
BUN: 15 mg/dL (ref 6–20)
CO2: 21 mmol/L — ABNORMAL LOW (ref 22–32)
Calcium: 9.2 mg/dL (ref 8.9–10.3)
Chloride: 103 mmol/L (ref 98–111)
Creatinine, Ser: 0.96 mg/dL (ref 0.44–1.00)
GFR, Estimated: >60 mL/min (ref >60)
Glucose, Bld: 108 mg/dL — ABNORMAL HIGH (ref 70–99)
Potassium: 3.5 mmol/L (ref 3.5–5.1)
Sodium: 139 mmol/L (ref 135–145)
Total Bilirubin: 0.4 mg/dL (ref 0.0–1.2)
Total Protein: 7.7 g/dL (ref 6.5–8.1)

## 2023-11-27 LAB — URINALYSIS, ROUTINE W REFLEX MICROSCOPIC
Bilirubin Urine: NEGATIVE
Glucose, UA: NEGATIVE mg/dL
Ketones, ur: NEGATIVE mg/dL
Leukocytes,Ua: NEGATIVE
Nitrite: NEGATIVE
Protein, ur: NEGATIVE mg/dL
Specific Gravity, Urine: 1.034 — ABNORMAL HIGH (ref 1.005–1.030)
pH: 5 (ref 5.0–8.0)

## 2023-11-27 LAB — ETHANOL: Alcohol, Ethyl (B): <15 mg/dL (ref <15)

## 2023-11-27 LAB — POCT URINE DRUG SCREEN - MANUAL ENTRY (I-SCREEN)
POC Amphetamine UR: NOT DETECTED
POC Buprenorphine (BUP): NOT DETECTED
POC Cocaine UR: NOT DETECTED
POC Marijuana UR: POSITIVE — AB
POC Methadone UR: NOT DETECTED
POC Methamphetamine UR: NOT DETECTED
POC Morphine: NOT DETECTED
POC Oxazepam (BZO): NOT DETECTED
POC Oxycodone UR: NOT DETECTED
POC Secobarbital (BAR): NOT DETECTED

## 2023-11-27 LAB — TSH: TSH: 0.813 u[IU]/mL (ref 0.350–4.500)

## 2023-11-27 LAB — LIPID PANEL
Cholesterol: 215 mg/dL — ABNORMAL HIGH (ref 0–200)
HDL: 49 mg/dL (ref >40)
LDL Cholesterol: 104 mg/dL — ABNORMAL HIGH (ref 0–99)
Total CHOL/HDL Ratio: 4.4 ratio
Triglycerides: 312 mg/dL — ABNORMAL HIGH (ref <150)
VLDL: 62 mg/dL — ABNORMAL HIGH (ref 0–40)

## 2023-11-27 LAB — CBC WITH DIFFERENTIAL/PLATELET
Abs Immature Granulocytes: 0.06 K/uL (ref 0.00–0.07)
Basophils Absolute: 0.1 K/uL (ref 0.0–0.1)
Basophils Relative: 0 %
Eosinophils Absolute: 0.2 K/uL (ref 0.0–0.5)
Eosinophils Relative: 1 %
HCT: 43.5 % (ref 36.0–46.0)
Hemoglobin: 14.9 g/dL (ref 12.0–15.0)
Immature Granulocytes: 0 %
Lymphocytes Relative: 17 %
Lymphs Abs: 2.7 K/uL (ref 0.7–4.0)
MCH: 31.8 pg (ref 26.0–34.0)
MCHC: 34.3 g/dL (ref 30.0–36.0)
MCV: 92.9 fL (ref 80.0–100.0)
Monocytes Absolute: 0.8 K/uL (ref 0.1–1.0)
Monocytes Relative: 5 %
Neutro Abs: 12 K/uL — ABNORMAL HIGH (ref 1.7–7.7)
Neutrophils Relative %: 77 %
Platelets: 305 K/uL (ref 150–400)
RBC: 4.68 MIL/uL (ref 3.87–5.11)
RDW: 13.5 % (ref 11.5–15.5)
WBC: 15.9 K/uL — ABNORMAL HIGH (ref 4.0–10.5)
nRBC: 0 % (ref 0.0–0.2)

## 2023-11-27 LAB — POC URINE PREG, ED: Preg Test, Ur: NEGATIVE

## 2023-11-27 LAB — HEMOGLOBIN A1C
Hgb A1c MFr Bld: 4.7 % — ABNORMAL LOW (ref 4.8–5.6)
Mean Plasma Glucose: 88.19 mg/dL

## 2023-11-27 LAB — MAGNESIUM: Magnesium: 1.8 mg/dL (ref 1.7–2.4)

## 2023-11-27 MED ORDER — CLONIDINE HCL 0.1 MG PO TABS
0.1000 mg | ORAL_TABLET | Freq: Once | ORAL | Status: AC
  Administered 2023-11-27: 0.1 mg via ORAL
  Filled 2023-11-27: qty 1
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
## 2023-11-27 NOTE — ED Notes (Signed)
 Pt A&O x 4, oriented to unit, pending labs & transfer to [facility].
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
## 2023-11-28 ENCOUNTER — Other Ambulatory Visit (HOSPITAL_COMMUNITY)
Admission: EM | Admit: 2023-11-28 | Discharge: 2023-11-28 | Disposition: A | Discharge status: Home / Self Care | Payer: MEDICAID | Source: Intra-hospital

## 2023-11-28 DIAGNOSIS — F32A Depression, unspecified: Secondary | ICD-10-CM | POA: Insufficient documentation

## 2023-11-28 DIAGNOSIS — Z62810 Personal history of physical and sexual abuse in childhood: Secondary | ICD-10-CM | POA: Insufficient documentation

## 2023-11-28 DIAGNOSIS — R454 Irritability and anger: Secondary | ICD-10-CM | POA: Insufficient documentation

## 2023-11-28 DIAGNOSIS — F1290 Cannabis use, unspecified, uncomplicated: Secondary | ICD-10-CM | POA: Diagnosis not present

## 2023-11-28 DIAGNOSIS — F1020 Alcohol dependence, uncomplicated: Secondary | ICD-10-CM | POA: Diagnosis not present

## 2023-11-28 DIAGNOSIS — F419 Anxiety disorder, unspecified: Secondary | ICD-10-CM | POA: Insufficient documentation

## 2023-11-28 DIAGNOSIS — F1090 Alcohol use, unspecified, uncomplicated: Secondary | ICD-10-CM

## 2023-11-28 MED ORDER — CHLORDIAZEPOXIDE HCL 25 MG PO CAPS: 25.0000 mg | ORAL_CAPSULE | Freq: Four times a day (QID) | ORAL | Status: DC | PRN

## 2023-11-28 MED ORDER — HYDROXYZINE HCL 25 MG PO TABS: 25.0000 mg | ORAL_TABLET | Freq: Four times a day (QID) | ORAL | Status: DC | PRN

## 2023-11-28 MED ORDER — ACETAMINOPHEN 325 MG PO TABS: 650.0000 mg | ORAL_TABLET | Freq: Four times a day (QID) | ORAL | Status: DC | PRN

## 2023-11-28 MED ORDER — ONDANSETRON 4 MG PO TBDP: 4.0000 mg | ORAL_TABLET | Freq: Four times a day (QID) | ORAL | Status: DC | PRN

## 2023-11-28 MED ORDER — TRAZODONE HCL 50 MG PO TABS: 50.0000 mg | ORAL_TABLET | Freq: Every evening | ORAL | Status: DC | PRN

## 2023-11-28 MED ORDER — DIPHENHYDRAMINE HCL 50 MG/ML IJ SOLN: 50.0000 mg | Freq: Three times a day (TID) | INTRAMUSCULAR | Status: DC | PRN

## 2023-11-28 MED ORDER — HALOPERIDOL LACTATE 5 MG/ML IJ SOLN: 10.0000 mg | Freq: Three times a day (TID) | INTRAMUSCULAR | Status: DC | PRN

## 2023-11-28 MED ORDER — HALOPERIDOL LACTATE 5 MG/ML IJ SOLN: 5.0000 mg | Freq: Three times a day (TID) | INTRAMUSCULAR | Status: DC | PRN

## 2023-11-28 MED ORDER — LOPERAMIDE HCL 2 MG PO CAPS: 2.0000 mg | ORAL_CAPSULE | ORAL | Status: DC | PRN

## 2023-11-28 MED ORDER — HALOPERIDOL 5 MG PO TABS: 5.0000 mg | ORAL_TABLET | Freq: Three times a day (TID) | ORAL | Status: DC | PRN

## 2023-11-28 MED ORDER — THIAMINE HCL 100 MG/ML IJ SOLN
100.0000 mg | Freq: Once | INTRAMUSCULAR | Status: AC
  Administered 2023-11-28: 100 mg via INTRAMUSCULAR
  Filled 2023-11-28: qty 2

## 2023-11-28 MED ORDER — LORAZEPAM 2 MG/ML IJ SOLN: 2.0000 mg | Freq: Three times a day (TID) | INTRAMUSCULAR | Status: DC | PRN

## 2023-11-28 MED ORDER — MAGNESIUM HYDROXIDE 400 MG/5ML PO SUSP: 30.0000 mL | Freq: Every day | ORAL | Status: DC | PRN

## 2023-11-28 MED ORDER — ADULT MULTIVITAMIN W/MINERALS CH
1.0000 | ORAL_TABLET | Freq: Every day | ORAL | Status: DC
  Administered 2023-11-28: 1 via ORAL
  Filled 2023-11-28: qty 1

## 2023-11-28 MED ORDER — ALUM & MAG HYDROXIDE-SIMETH 200-200-20 MG/5ML PO SUSP: 30.0000 mL | ORAL | Status: DC | PRN

## 2023-11-28 MED ORDER — DIPHENHYDRAMINE HCL 50 MG PO CAPS: 50.0000 mg | ORAL_CAPSULE | Freq: Three times a day (TID) | ORAL | Status: DC | PRN

## 2023-11-28 NOTE — ED Notes (Signed)
 Patient signed 20 request for discharge form. Provider Sherrell, NP made aware.

## 2023-11-28 NOTE — Group Note (Signed)
 Group Topic: Relapse and Recovery  Group Date: 11/28/2023 Start Time: 1145 End Time: 1152 Facilitators: Sidi Dzikowski, Zane HERO, RN  Department: [facility]  Number of Participants: 1  Group Focus: discharge education Treatment Modality:  Individual Therapy Interventions utilized were patient education Purpose: increase insight and relapse prevention strategies  Name: Briana Bradley Date of Birth: August 13, 1990  MR: [id number]    Level of Participation: active Quality of Participation: attentive, cooperative, and offered feedback Interactions with others: gave feedback Mood/Affect: appropriate Triggers (if applicable): None identified Cognition: insightful and logical Progress: Significant Response: Patient voiced understanding of all discharge instructions as presented to her. Safety plan reviewed, follow up care reviewed, resources provided. Facility contact number reviewed in case of future questions.  Plan: patient will be encouraged to refer to suicide safety plan and resources as needed  Patients Problems:  Patient Active Problem List   Diagnosis Date Noted   Alcohol use disorder, severe, dependence (HCC) 11/28/2023   Chronic left shoulder pain 09/25/2022   Smoker 09/25/2022   Elevated blood pressure reading 09/25/2022

## 2023-11-28 NOTE — Discharge Instructions (Addendum)
 Discharge Recommendations:   Outpatient Follow up: Please review list of outpatient resources for psychiatry, alcohol use, and counseling. Please follow up with your primary care provider for all medical related needs.    Therapy: We recommend that patient participate in individual therapy to address grief and mental health concerns.  Safety:   The following safety precautions should be taken:   No sharp objects. This includes scissors, razors, scrapers, and putty knives.   Chemicals should be removed and locked up.   Medications should be removed and locked up.   Weapons should be removed and locked up. This includes firearms, knives and instruments that can be used to cause injury.   The patient should abstain from use of illicit substances/drugs and abuse of any medications.  If symptoms worsen or do not continue to improve or if the patient becomes actively suicidal or homicidal then it is recommended that the patient return to the closest hospital emergency department, the [facility], or call 911 for further evaluation and treatment.  National Suicide Prevention Lifeline 1-800-SUICIDE or 719-800-2665.  About 988 988 offers 24/7 access to trained crisis counselors who can help people experiencing mental health-related distress. People can call or text 988 or chat 988lifeline.org for themselves or if they are worried about a loved one who may need crisis support.

## 2023-11-28 NOTE — ED Notes (Signed)
 Patient discharged home per provider order. After Visit Summary (AVS) printed and given to patient. AVS reviewed with patient and all questions fully answered. Patient discharged in no acute distress, A& O x4 and ambulatory. Patient denied SI/HI, A/VH upon discharge. Patient verbalized understanding of all discharge instructions explained by staff, including follow up instructions and safety plan. Patient mood fair. Patient belongings returned to patient from locker #29 complete and intact. Patient escorted to lobby via staff for transport to destination. Safety maintained.

## 2023-11-28 NOTE — ED Notes (Signed)
 Patient in hallway on phone. No acute distress noted. No concerns voiced. No inappropriate behaviors observed or reported at this time. Informed patient to notify staff with any needs or assistance. Patient verbalized understanding or agreement. Safety checks in place per facility policy.

## 2023-11-28 NOTE — ED Notes (Signed)
 Pt transferred from UC seeking help with AUD. Patient reports feeling suicidal earlier, with no specific plan but states she is not having suicidal thoughts at the moment. She reports she has ongoing grief from her brother passing away 2 years ago and also worried about her other brother who is in the prison due to substance abuse. She also lost her job recently and her relationship with her partner is currently unstable. She also report problem of alcohol use and seeking detox and rehab services. Her depression got worse when she lost her brother 2 years ago: brother died in his sleep when he was in rehab. Medication has been administered.Admission process completed. Q15 safety checks in place.

## 2023-11-28 NOTE — ED Notes (Signed)
 Patient alert & oriented x4. Denies intent to harm self or others when asked. Denies A/VH at this time but endorsed AH of crying last night. Patient denies any physical complaints when asked. No acute distress noted. Scheduled medications administered with no complications. Support and encouragement provided. Patient observed in milieu. No inappropriate behaviors observed or reported. Routine safety checks conducted per facility protocol. Encouraged patient to notify staff if any thoughts of harm towards self or others arise. Patient verbalizes understanding and agreement.

## 2023-11-28 NOTE — ED Notes (Signed)
 Pt is awake, says she can't sleep. Writer asked if she wants medication to help her sleep, she said no.

## 2023-11-28 NOTE — ED Notes (Addendum)
 Pt is in his room coloring. No acute distress noted. Respirations are even and labored. Q15 safety checks in place.

## 2023-11-28 NOTE — ED Notes (Signed)
 Patient resting with eyes closed in no apparent acute distress. Respirations even and unlabored. Environment secured. Safety checks in place according to facility policy.
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
## 2024-02-22 ENCOUNTER — Ambulatory Visit: Payer: MEDICAID | Attending: Nurse Practitioner

## 2024-02-22 ENCOUNTER — Ambulatory Visit (INDEPENDENT_AMBULATORY_CARE_PROVIDER_SITE_OTHER): Payer: MEDICAID | Admitting: Nurse Practitioner

## 2024-02-22 ENCOUNTER — Encounter: Payer: Self-pay | Admitting: Nurse Practitioner

## 2024-02-22 ENCOUNTER — Other Ambulatory Visit: Payer: Self-pay | Admitting: Nurse Practitioner

## 2024-02-22 VITALS — BP 143/91 | HR 81 | Temp 98.0°F | Ht 67.13 in | Wt 247.8 lb

## 2024-02-22 DIAGNOSIS — F3181 Bipolar II disorder: Secondary | ICD-10-CM | POA: Insufficient documentation

## 2024-02-22 DIAGNOSIS — I10 Essential (primary) hypertension: Secondary | ICD-10-CM | POA: Diagnosis not present

## 2024-02-22 DIAGNOSIS — R079 Chest pain, unspecified: Secondary | ICD-10-CM

## 2024-02-22 DIAGNOSIS — F1020 Alcohol dependence, uncomplicated: Secondary | ICD-10-CM | POA: Diagnosis not present

## 2024-02-22 DIAGNOSIS — Z7689 Persons encountering health services in other specified circumstances: Secondary | ICD-10-CM | POA: Diagnosis not present

## 2024-02-22 DIAGNOSIS — N644 Mastodynia: Secondary | ICD-10-CM

## 2024-02-22 DIAGNOSIS — Z1231 Encounter for screening mammogram for malignant neoplasm of breast: Secondary | ICD-10-CM

## 2024-02-22 DIAGNOSIS — Z23 Encounter for immunization: Secondary | ICD-10-CM | POA: Diagnosis not present

## 2024-02-22 MED ORDER — OLMESARTAN MEDOXOMIL 20 MG PO TABS: 20.0000 mg | ORAL_TABLET | Freq: Every day | ORAL | 0 refills | Status: AC

## 2024-02-22 NOTE — Progress Notes (Signed)
 "  BP (!) 143/91 (BP Location: Right Arm, Cuff Size: Large)   Pulse 81   Temp 98 F (36.7 C) (Oral)   Ht 5' 7.13 (1.705 m)   Wt 247 lb 12.8 oz (112.4 kg)   LMP 09/11/2023 (Approximate)   SpO2 98%   BMI 38.67 kg/m    Subjective:    Patient ID: Briana Bradley, female    DOB: 1990-07-10, 34 y.o.   MRN: [id number]  HPI: Briana Bradley is a 34 y.o. female  Chief Complaint  Patient presents with   Establish Care    NP. Patient stated she's been feeling dizzy, lightheaded, body aches, and she gets tired extremely fast now. She also mentioned she has vertigo and she shakes sometimes.   Patient presents to clinic to establish care with new PCP.  Introduced to publishing rights manager role and practice setting.  All questions answered.  Discussed provider/patient relationship and expectations.  Patient reports a history of Vertigo, Asthma, Alcohol use, Bipolar 2 and Schizoeffective disorder.   Patient denies a history of: Hypertension, Elevated Cholesterol, Diabetes, Thyroid  problems, Neurological problems, and Abdominal problems.   ALCOHOL USE Patient states she has quit drinking.  She does smoke a vape pen CBD.  She is no longer smoking marijuana and does smoke a 1ppd.  She quit drinking about 1 month ago.  She hasn't had any issues with withdrawal.    HYPERTENSION without Chronic Kidney Disease Hypertension status: uncontrolled  Satisfied with current treatment? yes Duration of hypertension: years BP monitoring frequency:  not checking BP range:  BP medication side effects:  no Medication compliance: excellent compliance Previous BP meds:none Aspirin: no Recurrent headaches: yes Visual changes: no Palpitations: yes Dyspnea: no Chest pain: no Lower extremity edema: no Dizzy/lightheaded: yes  Patient states she has breast tenderness all of the time.  She has never had a mammogram.   Active Ambulatory Problems    Diagnosis Date Noted   Chronic left shoulder pain 09/25/2022    Smoker 09/25/2022   Alcohol use disorder, severe, dependence (HCC) 11/28/2023   Bipolar 2 disorder (HCC) 02/22/2024   Primary hypertension 02/22/2024   Resolved Ambulatory Problems    Diagnosis Date Noted   Elevated blood pressure reading 09/25/2022   Past Medical History:  Diagnosis Date   Asthma    Bipolar 1 disorder (HCC)    Schizo affective schizophrenia (HCC)    History reviewed. No pertinent surgical history. History reviewed. No pertinent family history.   Review of Systems  Eyes:  Negative for visual disturbance.  Respiratory:  Negative for cough, chest tightness and shortness of breath.   Cardiovascular:  Positive for chest pain and palpitations. Negative for leg swelling.  Skin:        Breast tenderness  Neurological:  Positive for dizziness and headaches.  Psychiatric/Behavioral:  Positive for dysphoric mood. Negative for suicidal ideas. The patient is nervous/anxious.     Per HPI unless specifically indicated above     Objective:    BP (!) 143/91 (BP Location: Right Arm, Cuff Size: Large)   Pulse 81   Temp 98 F (36.7 C) (Oral)   Ht 5' 7.13 (1.705 m)   Wt 247 lb 12.8 oz (112.4 kg)   LMP 09/11/2023 (Approximate)   SpO2 98%   BMI 38.67 kg/m   Wt Readings from Last 3 Encounters:  02/22/24 247 lb 12.8 oz (112.4 kg)  03/13/21 200 lb (90.7 kg)  11/05/20 73 lb 4 oz (33.2 kg)    Physical Exam  Vitals and nursing note reviewed.  Constitutional:      General: She is not in acute distress.    Appearance: Normal appearance. She is normal weight. She is not ill-appearing, toxic-appearing or diaphoretic.  HENT:     Head: Normocephalic.     Right Ear: External ear normal.     Left Ear: External ear normal.     Nose: Nose normal.     Mouth/Throat:     Mouth: Mucous membranes are moist.     Pharynx: Oropharynx is clear.  Eyes:     General:        Right eye: No discharge.        Left eye: No discharge.     Extraocular Movements: Extraocular movements  intact.     Conjunctiva/sclera: Conjunctivae normal.     Pupils: Pupils are equal, round, and reactive to light.  Cardiovascular:     Rate and Rhythm: Normal rate and regular rhythm.     Heart sounds: No murmur heard. Pulmonary:     Effort: Pulmonary effort is normal. No respiratory distress.     Breath sounds: Normal breath sounds. No wheezing or rales.  Musculoskeletal:     Cervical back: Normal range of motion and neck supple.  Skin:    General: Skin is warm and dry.     Capillary Refill: Capillary refill takes less than 2 seconds.  Neurological:     General: No focal deficit present.     Mental Status: She is alert and oriented to person, place, and time. Mental status is at baseline.  Psychiatric:        Mood and Affect: Mood normal.        Behavior: Behavior normal.        Thought Content: Thought content normal.        Judgment: Judgment normal.     Results for orders placed or performed during the hospital encounter of 11/27/23  CBC with Differential/Platelet   Collection Time: 11/27/23  9:19 PM  Result Value Ref Range   WBC 15.9 (H) 4.0 - 10.5 K/uL   RBC 4.68 3.87 - 5.11 MIL/uL   Hemoglobin 14.9 12.0 - 15.0 g/dL   HCT 56.4 63.9 - 53.9 %   MCV 92.9 80.0 - 100.0 fL   MCH 31.8 26.0 - 34.0 pg   MCHC 34.3 30.0 - 36.0 g/dL   RDW 86.4 88.4 - 84.4 %   Platelets 305 150 - 400 K/uL   nRBC 0.0 0.0 - 0.2 %   Neutrophils Relative % 77 %   Neutro Abs 12.0 (H) 1.7 - 7.7 K/uL   Lymphocytes Relative 17 %   Lymphs Abs 2.7 0.7 - 4.0 K/uL   Monocytes Relative 5 %   Monocytes Absolute 0.8 0.1 - 1.0 K/uL   Eosinophils Relative 1 %   Eosinophils Absolute 0.2 0.0 - 0.5 K/uL   Basophils Relative 0 %   Basophils Absolute 0.1 0.0 - 0.1 K/uL   Immature Granulocytes 0 %   Abs Immature Granulocytes 0.06 0.00 - 0.07 K/uL  Comprehensive metabolic panel   Collection Time: 11/27/23  9:19 PM  Result Value Ref Range   Sodium 139 135 - 145 mmol/L   Potassium 3.5 3.5 - 5.1 mmol/L    Chloride 103 98 - 111 mmol/L   CO2 21 (L) 22 - 32 mmol/L   Glucose, Bld 108 (H) 70 - 99 mg/dL   BUN 15 6 - 20 mg/dL   Creatinine, Ser 9.03 0.44 - 1.00  mg/dL   Calcium 9.2 8.9 - 89.6 mg/dL   Total Protein 7.7 6.5 - 8.1 g/dL   Albumin 4.1 3.5 - 5.0 g/dL   AST 26 15 - 41 U/L   ALT 23 0 - 44 U/L   Alkaline Phosphatase 67 38 - 126 U/L   Total Bilirubin 0.4 0.0 - 1.2 mg/dL   GFR, Estimated >39 >39 mL/min   Anion gap 15 5 - 15  Hemoglobin A1c   Collection Time: 11/27/23  9:19 PM  Result Value Ref Range   Hgb A1c MFr Bld 4.7 (L) 4.8 - 5.6 %   Mean Plasma Glucose 88.19 mg/dL  Magnesium    Collection Time: 11/27/23  9:19 PM  Result Value Ref Range   Magnesium  1.8 1.7 - 2.4 mg/dL  Ethanol   Collection Time: 11/27/23  9:19 PM  Result Value Ref Range   Alcohol, Ethyl (B) <15 <15 mg/dL  Lipid panel   Collection Time: 11/27/23  9:19 PM  Result Value Ref Range   Cholesterol 215 (H) 0 - 200 mg/dL   Triglycerides 687 (H) <150 mg/dL   HDL 49 >59 mg/dL   Total CHOL/HDL Ratio 4.4 RATIO   VLDL 62 (H) 0 - 40 mg/dL   LDL Cholesterol 895 (H) 0 - 99 mg/dL  TSH   Collection Time: 11/27/23  9:19 PM  Result Value Ref Range   TSH 0.813 0.350 - 4.500 uIU/mL  POC urine preg, ED   Collection Time: 11/27/23  9:22 PM  Result Value Ref Range   Preg Test, Ur Negative Negative  POCT Urine Drug Screen - (I-Screen)   Collection Time: 11/27/23  9:22 PM  Result Value Ref Range   POC Amphetamine UR None Detected NONE DETECTED (Cut Off Level 1000 ng/mL)   POC Secobarbital (BAR) None Detected NONE DETECTED (Cut Off Level 300 ng/mL)   POC Buprenorphine (BUP) None Detected NONE DETECTED (Cut Off Level 10 ng/mL)   POC Oxazepam (BZO) None Detected NONE DETECTED (Cut Off Level 300 ng/mL)   POC Cocaine UR None Detected NONE DETECTED (Cut Off Level 300 ng/mL)   POC Methamphetamine UR None Detected NONE DETECTED (Cut Off Level 1000 ng/mL)   POC Morphine None Detected NONE DETECTED (Cut Off Level 300 ng/mL)   POC  Methadone UR None Detected NONE DETECTED (Cut Off Level 300 ng/mL)   POC Oxycodone  UR None Detected NONE DETECTED (Cut Off Level 100 ng/mL)   POC Marijuana UR Positive (A) NONE DETECTED (Cut Off Level 50 ng/mL)  Urinalysis, Routine w reflex microscopic -Urine, Clean Catch   Collection Time: 11/27/23  9:25 PM  Result Value Ref Range   Color, Urine YELLOW YELLOW   APPearance TURBID (A) CLEAR   Specific Gravity, Urine 1.034 (H) 1.005 - 1.030   pH 5.0 5.0 - 8.0   Glucose, UA NEGATIVE NEGATIVE mg/dL   Hgb urine dipstick SMALL (A) NEGATIVE   Bilirubin Urine NEGATIVE NEGATIVE   Ketones, ur NEGATIVE NEGATIVE mg/dL   Protein, ur NEGATIVE NEGATIVE mg/dL   Nitrite NEGATIVE NEGATIVE   Leukocytes,Ua NEGATIVE NEGATIVE   RBC / HPF 0-5 0 - 5 RBC/hpf   WBC, UA 0-5 0 - 5 WBC/hpf   Bacteria, UA RARE (A) NONE SEEN   Squamous Epithelial / HPF 0-5 0 - 5 /HPF   Mucus PRESENT       Assessment & Plan:   Problem List Items Addressed This Visit       Cardiovascular and Mediastinum   Primary hypertension   New diagnosis.  Will start Olmesartan  20mg  daily.  Side effects and benefits of medication discussed.  Follow up in 1 month.  Call sooner if concerns arise.      Relevant Medications   olmesartan  (BENICAR ) 20 MG tablet     Other   Alcohol use disorder, severe, dependence (HCC) - Primary   In remission.  Patient stopped drinking 1 month ago.       Bipolar 2 disorder [facility])   Referral placed for psychiatry.  Patient admits that mood has worsened with anxiety.  Agrees that seeing psychiatry would be beneficial.      Relevant Orders   Ambulatory referral to Psychiatry   Other Visit Diagnoses       Breast tenderness       Ongoing breast tenderness at all times. Not limited to her cycle.  Mammogram ordered for evaluation.   Relevant Orders   MM 3D SCREENING MAMMOGRAM BILATERAL BREAST     Chest pain, unspecified type       EKG showed NSR. ZIO monitor ordered due to feeling of palpitations.  Suspect some of her symptoms are r/tanxiety. If ZIO is abnormal can refer to Cardiology.   Relevant Orders   EKG 12-Lead   LONG TERM MONITOR (3-14 DAYS)     Flu vaccine need       Relevant Orders   Flu vaccine trivalent PF, 6mos and older(Flulaval,Afluria,Fluarix,Fluzone) (Completed)     Encounter to establish care            Follow up plan: Return in about 1 month (around 03/24/2024) for Physical and Fasting labs.      "

## 2024-02-22 NOTE — Assessment & Plan Note (Signed)
 New diagnosis.  Will start Olmesartan  20mg  daily.  Side effects and benefits of medication discussed.  Follow up in 1 month.  Call sooner if concerns arise.

## 2024-02-22 NOTE — Assessment & Plan Note (Signed)
 In remission.  Patient stopped drinking 1 month ago.

## 2024-02-22 NOTE — Assessment & Plan Note (Signed)
 Referral placed for psychiatry.  Patient admits that mood has worsened with anxiety.  Agrees that seeing psychiatry would be beneficial.

## 2024-02-26 ENCOUNTER — Ambulatory Visit
Admission: RE | Admit: 2024-02-26 | Discharge: 2024-02-26 | Disposition: A | Discharge status: Home / Self Care | Payer: MEDICAID | Source: Ambulatory Visit | Attending: Nurse Practitioner | Admitting: Nurse Practitioner

## 2024-02-26 DIAGNOSIS — N644 Mastodynia: Secondary | ICD-10-CM

## 2024-02-26 DIAGNOSIS — Z1231 Encounter for screening mammogram for malignant neoplasm of breast: Secondary | ICD-10-CM | POA: Insufficient documentation

## 2024-02-29 ENCOUNTER — Ambulatory Visit: Payer: Self-pay | Admitting: Nurse Practitioner

## 2024-03-24 ENCOUNTER — Encounter: Payer: MEDICAID | Admitting: Nurse Practitioner
